# Patient Record
Sex: Male | Born: 1945 | Race: White | Hispanic: No | Marital: Married | Smoking: Former smoker
Health system: Southern US, Community
[De-identification: ages and names within clinical notes are randomized; demographics above are authoritative.]

## PROBLEM LIST (undated history)

## (undated) DIAGNOSIS — Z860101 Personal history of adenomatous and serrated colon polyps: Secondary | ICD-10-CM

## (undated) DIAGNOSIS — M2142 Flat foot [pes planus] (acquired), left foot: Secondary | ICD-10-CM

## (undated) DIAGNOSIS — E785 Hyperlipidemia, unspecified: Secondary | ICD-10-CM

## (undated) DIAGNOSIS — Z86010 Personal history of colonic polyps: Secondary | ICD-10-CM

## (undated) DIAGNOSIS — K219 Gastro-esophageal reflux disease without esophagitis: Secondary | ICD-10-CM

## (undated) DIAGNOSIS — K5790 Diverticulosis of intestine, part unspecified, without perforation or abscess without bleeding: Secondary | ICD-10-CM

## (undated) DIAGNOSIS — R5383 Other fatigue: Secondary | ICD-10-CM

## (undated) DIAGNOSIS — Z8546 Personal history of malignant neoplasm of prostate: Secondary | ICD-10-CM

## (undated) DIAGNOSIS — M2141 Flat foot [pes planus] (acquired), right foot: Secondary | ICD-10-CM

## (undated) DIAGNOSIS — R7989 Other specified abnormal findings of blood chemistry: Secondary | ICD-10-CM

## (undated) DIAGNOSIS — E7800 Pure hypercholesterolemia, unspecified: Secondary | ICD-10-CM

## (undated) HISTORY — DX: Personal history of malignant neoplasm of prostate: Z85.46

## (undated) HISTORY — DX: Personal history of adenomatous and serrated colon polyps: Z86.0101

## (undated) HISTORY — DX: Flat foot (pes planus) (acquired), right foot: M21.41

## (undated) HISTORY — DX: Other specified abnormal findings of blood chemistry: R79.89

## (undated) HISTORY — DX: Other fatigue: R53.83

## (undated) HISTORY — DX: Personal history of colonic polyps: Z86.010

## (undated) HISTORY — DX: Diverticulosis of intestine, part unspecified, without perforation or abscess without bleeding: K57.90

## (undated) HISTORY — DX: Pure hypercholesterolemia, unspecified: E78.00

## (undated) HISTORY — DX: Gastro-esophageal reflux disease without esophagitis: K21.9

## (undated) HISTORY — DX: Flat foot (pes planus) (acquired), right foot: M21.42

---

## 2008-07-08 ENCOUNTER — Ambulatory Visit: Admission: RE | Admit: 2008-07-08 | Discharge: 2008-07-26 | Payer: Self-pay | Admitting: Radiation Oncology

## 2008-09-19 ENCOUNTER — Encounter (INDEPENDENT_AMBULATORY_CARE_PROVIDER_SITE_OTHER): Payer: Self-pay | Admitting: Urology

## 2008-09-19 ENCOUNTER — Inpatient Hospital Stay (HOSPITAL_COMMUNITY): Admission: RE | Admit: 2008-09-19 | Discharge: 2008-09-20 | Payer: Self-pay | Admitting: Urology

## 2010-11-13 LAB — CBC
HCT: 44.1 % (ref 39.0–52.0)
HCT: 39.2 % (ref 39.0–52.0)
Hemoglobin: 14.9 g/dL (ref 13.0–17.0)
Hemoglobin: 13.1 g/dL (ref 13.0–17.0)
MCHC: 33.8 g/dL (ref 30.0–36.0)
MCHC: 33.5 g/dL (ref 30.0–36.0)
MCV: 90.1 fL (ref 78.0–100.0)
MCV: 90.5 fL (ref 78.0–100.0)
Platelets: 168 10*3/uL (ref 150–400)
Platelets: 151 10*3/uL (ref 150–400)
RDW: 14.2 % (ref 11.5–15.5)
RDW: 13.6 % (ref 11.5–15.5)
WBC: 8.9 10*3/uL (ref 4.0–10.5)

## 2010-11-13 LAB — DIFFERENTIAL
Basophils Absolute: 0 10*3/uL (ref 0.0–0.1)
Basophils Relative: 0 % (ref 0–1)
Eosinophils Absolute: 0 10*3/uL (ref 0.0–0.7)
Eosinophils Absolute: 0 10*3/uL (ref 0.0–0.7)
Eosinophils Relative: 1 % (ref 0–5)
Lymphs Abs: 1.4 10*3/uL (ref 0.7–4.0)
Monocytes Absolute: 0.6 10*3/uL (ref 0.1–1.0)
Neutrophils Relative %: 72 % (ref 43–77)

## 2010-11-13 LAB — BASIC METABOLIC PANEL
BUN: 15 mg/dL (ref 6–23)
BUN: 10 mg/dL (ref 6–23)
CO2: 28 mEq/L (ref 19–32)
CO2: 28 mEq/L (ref 19–32)
Calcium: 9.4 mg/dL (ref 8.4–10.5)
Chloride: 103 mEq/L (ref 96–112)
Creatinine, Ser: 1.16 mg/dL (ref 0.4–1.5)
GFR calc non Af Amer: >60 mL/min (ref >60)
Glucose, Bld: 83 mg/dL (ref 70–99)
Glucose, Bld: 99 mg/dL (ref 70–99)
Potassium: 4.3 mEq/L (ref 3.5–5.1)
Sodium: 141 mEq/L (ref 135–145)
Sodium: 139 mEq/L (ref 135–145)

## 2010-12-11 NOTE — Op Note (Signed)
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   POSTOPERATIVE DIAGNOSIS:  Clinically localized adenocarcinoma of  prostate (clinical stage T1c, NX, MX).   PROCEDURES:  1. Robotic-assisted laparoscopic radical prostatectomy (bilateral      nerve sparing).  2. Umbilical hernia repair.   SURGEON:  Dr. Heloise Purpura.   ASSISTANT:  Delia Chimes, nurse practitioner.   ANESTHESIA:  General.   COMPLICATIONS:  None.   ESTIMATED BLOOD LOSS:  100 mL.   INTRAVENOUS FLUIDS:  2 liters of lactated Ringer's.   SPECIMENS:  Prostate seminal vesicles.   DISPOSITION:  Specimen to Pathology.   DRAINS:  1. A 20-French Coude catheter.  2. A #19 Blake pelvic drain.   INDICATION:  [name] is a 65 year old gentleman with clinically  localized adenocarcinoma of Roberto prostate.  After discussion regarding  management options for treatment, [name] elected to proceed with surgical  therapy and Roberto above procedure.  In addition, [name] was noted on  preoperative evaluation to have an umbilical hernia.  [name] was mildly  symptomatic from this and after discussion regarding options for  management, [name] did elect to undergo concomitant umbilical hernia repair.  Roberto potential risks, complications, and alternative treatment options  associated with Roberto above procedures were discussed in detail and  informed consent was obtained.   DESCRIPTION OF PROCEDURE:  [name] was taken to Roberto operating room  and a general anesthetic was administered.  [name] was given preoperative  antibiotics, placed in Roberto dorsal  lithotomy position, and prepped and  draped in Roberto usual sterile fashion.  Next, a preoperative time-out was  performed.  An attempt was made to place a 22-French catheter.  However,  [name]'s urethra was noted to be somewhat stenotic.  Therefore, a  20-French Foley catheter was used to catheterize [name].  This was  placed without difficulty.  On examination of his abdomen, [name] was noted  to have an umbilical hernia measuring approximately 1.5 cm.  A site was  selected just superior to Roberto umbilicus for placement of Roberto camera  port.  This was placed using a standard open Hassan technique which  allowed entry into Roberto peritoneal cavity under direct vision without  difficulty.  A 12 mm port was then placed and a pneumoperitoneum  established.  Roberto 0 degrees lens was used to inspect Roberto abdomen and  there was no evidence for any intra-abdominal injuries or other  abnormalities.  Roberto remaining ports were then placed.  Bilateral 8 mm  robotic ports were placed 10 cm lateral to and just inferior to Roberto  camera port site.  An additional 8 mm robotic port was placed in Roberto far  left lateral abdominal wall.  A 5 mm port was placed between Roberto camera  port and Roberto right robotic port and a 12 mm port was placed in Roberto far  right lateral abdominal wall for laparoscopic assistance.  All ports  were placed under direct vision without difficulty.  Roberto surgical cart  was then docked.  With Roberto aid of Roberto cautery scissors, Roberto bladder was  reflected posteriorly allowing entry into Roberto space of Retzius and  identification of Roberto endopelvic fascia and prostate.  Roberto endopelvic  fascia was then incised from Roberto apex back to Roberto base of Roberto prostate  bilaterally and Roberto underlying levator muscle fibers were swept  laterally off Roberto prostate.  Roberto dorsal venous complex was thereby  isolated and it was then stapled and divided with a 45 mm flex ETS  stapler.  Roberto bladder neck was identified with Roberto  aid of Foley catheter  manipulation and it was divided anteriorly thereby exposing Roberto Foley  catheter.  Roberto catheter balloon was deflated and Roberto catheter was  brought into Roberto operative field and used to retract Roberto prostate  anteriorly.  Roberto posterior bladder neck was then divided and dissection  proceeded between Roberto prostate and bladder until Roberto vasa deferentia and  seminal vesicles were identified.  Roberto vasa deferentia were isolated,  divided and lifted anteriorly.  Roberto seminal vesicles were then dissected  down to their tips with care to control Roberto seminal vesicle arterial  blood supply with Hem-o-lok clips.  Roberto seminal vesicles were then  lifted anteriorly in Roberto space between Denonvilliers' fascia and Roberto  anterior rectum was bluntly developed thereby isolating Roberto vascular  pedicles of Roberto prostate.  Roberto lateral prostatic fascia was then incised  bilaterally allowing Roberto neurovascular bundles to be released.  Roberto  vascular pedicles of Roberto prostate were then ligated with Hem-o-lok clips  above Roberto level of Roberto neurovascular bundles and Roberto neurovascular  bundles were swept off Roberto apex of Roberto prostate and urethra.  Roberto  urethra was sharply transected allowing Roberto prostate specimen to be  disarticulated.  Roberto pelvis was then copiously irrigated and hemostasis  was ensured.  With irrigation in Roberto pelvis, air was injected into Roberto  rectal catheter and there was no evidence for a rectal injury.  Attention then turned to Roberto urethral anastomosis.  A 2-0 Vicryl slip-  knot was placed between Roberto posterior bladder neck, Denonvilliers'  fascia, and Roberto posterior bladder neck to reapproximate these  structures.  A double-armed 3-0 Monocryl suture was then used to perform  a 360 degree running tension-free anastomosis between Roberto bladder neck  and urethra.  A new 20-French Coude catheter was inserted into Roberto  bladder and Roberto catheter was irrigated.  Roberto anastomosis appeared to be   watertight and there were no blood clots within Roberto catheter.  A #19  Blake drain was brought through Roberto left robotic port and appropriately  positioned in Roberto pelvis.  It was secured to Roberto skin with a nylon  suture.  Roberto surgical cart was undocked.  Roberto right lateral 12 mm port  site was closed with a 0-Vicryl suture placed with Roberto aid of Roberto suture  passer device.  All remaining ports were then removed under direct  vision.  Roberto periumbilical camera port incision was then extended  inferiorly in a semicircular fashion around Roberto left side of Roberto  umbilicus.  Roberto camera port site was then extended down into Roberto fascial  opening that represented Roberto umbilical hernia.  Roberto fat off Roberto  abdominal wall within Roberto umbilical hernia was removed and 0-Prolene  figure-of-eight sutures were then used to repair Roberto hernia defect  reapproximating Roberto abdominal fascia.  Roberto upper portion of this  incision was closed with a running 0- Prolene suture.  This appeared to  result in excellent closure of Roberto fascia.  All port sites and incision  sites were injected with quarter percent Marcaine and reapproximated at  Roberto skin level with staples.  Sterile dressings were applied.  Roberto  Barr appeared to tolerate Roberto procedure well without complications.  [name] was able to be extubated and transferred into Roberto recovery unit in  satisfactory condition.     Heloise Purpura, MD  Electronically Signed    LB/MEDQ  D:  09/19/2008  T:  09/19/2008  Job:  [id number]

## 2012-02-09 ENCOUNTER — Emergency Department (HOSPITAL_COMMUNITY)
Admission: EM | Admit: 2012-02-09 | Discharge: 2012-02-09 | Disposition: A | Discharge status: Home / Self Care | Payer: Federal, State, Local not specified - PPO | Attending: Emergency Medicine | Admitting: Emergency Medicine

## 2012-02-09 ENCOUNTER — Encounter (HOSPITAL_COMMUNITY): Payer: Self-pay | Admitting: Emergency Medicine

## 2012-02-09 DIAGNOSIS — R55 Syncope and collapse: Secondary | ICD-10-CM | POA: Insufficient documentation

## 2012-02-09 DIAGNOSIS — R404 Transient alteration of awareness: Secondary | ICD-10-CM | POA: Insufficient documentation

## 2012-02-09 DIAGNOSIS — Z7982 Long term (current) use of aspirin: Secondary | ICD-10-CM | POA: Insufficient documentation

## 2012-02-09 DIAGNOSIS — R42 Dizziness and giddiness: Secondary | ICD-10-CM | POA: Insufficient documentation

## 2012-02-09 DIAGNOSIS — Z79899 Other long term (current) drug therapy: Secondary | ICD-10-CM | POA: Insufficient documentation

## 2012-02-09 LAB — POCT I-STAT, CHEM 8
Creatinine, Ser: 1.4 mg/dL — ABNORMAL HIGH (ref 0.50–1.35)
HCT: 44 % (ref 39.0–52.0)
Hemoglobin: 15 g/dL (ref 13.0–17.0)
Potassium: 4.6 mEq/L (ref 3.5–5.1)
Sodium: 139 mEq/L (ref 135–145)

## 2012-02-09 MED ORDER — SODIUM CHLORIDE 0.9 % IV BOLUS (SEPSIS)
1000.0000 mL | Freq: Once | INTRAVENOUS | Status: AC
  Administered 2012-02-09: 1000 mL via INTRAVENOUS

## 2012-02-09 NOTE — ED Notes (Signed)
Patient brought in via [facility]. With a syncopal episode patient was in church and singing and he felt hot and clammy prior to this event. He did not eat today, he advises that he has feel some sinus type symptoms this week, other than that he denies any problems. He consumed ETOH last pm and feel that he may be dehydrated. He denies pain or discomfort at this time. Vital signs stable.

## 2012-02-09 NOTE — ED Notes (Signed)
IV  Bolus  completed

## 2012-02-09 NOTE — ED Provider Notes (Signed)
History     CSN: [id number]  Arrival date & time 02/09/12  1234   First MD Initiated Contact with Patient 02/09/12 1257      Chief Complaint  Patient presents with  . Loss of Consciousness   history of present illness: Patient suffered syncopal event after standing for 3.5 hours in church this morning. Reports feeling lightheaded immediately prior to event. Patient also admits to drinking alcohol last night. He denies having had any chest pain shortness of breath headache or abdominal pain. He is presently asymptomatic. Treat with oxygen while in route. Brought by EMS. (Consider location/radiation/quality/duration/timing/severity/associated sxs/prior treatment) HPI  History reviewed. No pertinent past medical history. Past medical history hypercholesterolemia, prostate cancer History reviewed. No pertinent past surgical history. Surgical history History reviewed. No pertinent family history. Prostate surgery History  Substance Use Topics  . Smoking status: Not on file  . Smokeless tobacco: Not on file  . Alcohol Use: Not on file     social history nonsmoker positive alcohol no illicit drug use Review of Systems  Constitutional: Negative.   HENT: Negative.   Respiratory: Negative.   Cardiovascular:       Syncope  Gastrointestinal: Negative.   Musculoskeletal: Negative.   Skin: Negative.   Neurological: Negative.   Hematological: Negative.   Psychiatric/Behavioral: Negative.   All other systems reviewed and are negative.    Allergies  Review of patient's allergies indicates no known allergies.  Home Medications   Current Outpatient Rx  Name Route Sig Dispense Refill  . ASPIRIN EC 81 MG PO TBEC Oral Take 81 mg by mouth at bedtime.    . CO Q 10 PO Oral Take 1 tablet by mouth every morning.    Marland Kitchen OMEGA-3 FATTY ACIDS 1000 MG PO CAPS Oral Take 1 g by mouth 2 (two) times daily. Take 1 tablet in the morning and take 2 tablets at night.    Marland Kitchen GLUCOSAMINE-CHONDROITIN PO  Oral Take 1 tablet by mouth daily.    Marland Kitchen ROSUVASTATIN CALCIUM 10 MG PO TABS Oral Take 10 mg by mouth every morning.      BP 109/73  Pulse 85  Temp 98.3 F (36.8 C) (Oral)  Resp 16  SpO2 94%  Physical Exam  Nursing note and vitals reviewed. Constitutional: He is oriented to person, place, and time. He appears well-developed and well-nourished.  HENT:  Head: Normocephalic and atraumatic.       Mucous membranes dry  Eyes: Conjunctivae are normal. Pupils are equal, round, and reactive to light.  Neck: Neck supple. No tracheal deviation present. No thyromegaly present.  Cardiovascular: Normal rate and regular rhythm.   No murmur heard. Pulmonary/Chest: Effort normal and breath sounds normal.  Abdominal: Soft. Bowel sounds are normal. He exhibits no distension. There is no tenderness.  Musculoskeletal: Normal range of motion. He exhibits no edema and no tenderness.  Neurological: He is alert and oriented to person, place, and time. Coordination normal.       Gait normal not lightheaded on standing  Skin: Skin is warm and dry. No rash noted.  Psychiatric: He has a normal mood and affect.    ED Course  Procedures (including critical care time)  Labs Reviewed - No data to display No results found.   Date: 02/09/2012  Rate: 65  Rhythm: normal sinus rhythm  QRS Axis: normal  Intervals: normal  ST/T Wave abnormalities: normal  Conduction Disutrbances: none  Narrative Interpretation: unremarkable Unchanged from 09/09/2008  Results for orders placed during the hospital  encounter of 02/09/12  POCT I-STAT, CHEM 8      Component Value Range   Sodium 139  135 - 145 mEq/L   Potassium 4.6  3.5 - 5.1 mEq/L   Chloride 103  96 - 112 mEq/L   BUN 21  6 - 23 mg/dL   Creatinine, Ser 1.61 (*) 0.50 - 1.35 mg/dL   Glucose, Bld 096 (*) 70 - 99 mg/dL   Calcium, Ion 0.45  4.09 - 1.30 mmol/L   TCO2 26  0 - 100 mmol/L   Hemoglobin 15.0  13.0 - 17.0 g/dL   HCT 81.1  91.4 - 78.2 %   No results  found.  No diagnosis found.  3:20 PM patient feels well and ready to gog home MDM  Syncope likely due to mild dehydration   plan encourage by mouth hydration   avoid alcohol Followup Dr. Tenny Craw this week diagnosis #1 syncope #2 renal insufficiency       Doug Sou, MD 02/09/12 1553

## 2012-02-09 NOTE — ED Notes (Signed)
Patient alert and oriented time 4, he denies pain or discomfort. He is being discharged with his wife with instructions, he and his wife verbalize an understanding.

## 2018-12-17 ENCOUNTER — Other Ambulatory Visit: Payer: Self-pay | Admitting: Family Medicine

## 2018-12-17 DIAGNOSIS — Z0000 Encounter for general adult medical examination without abnormal findings: Secondary | ICD-10-CM

## 2018-12-30 ENCOUNTER — Ambulatory Visit
Admission: RE | Admit: 2018-12-30 | Discharge: 2018-12-30 | Disposition: A | Discharge status: Home / Self Care | Payer: Federal, State, Local not specified - PPO | Source: Ambulatory Visit | Attending: Family Medicine | Admitting: Family Medicine

## 2018-12-30 ENCOUNTER — Other Ambulatory Visit: Payer: Self-pay | Admitting: Family Medicine

## 2018-12-30 DIAGNOSIS — Z0000 Encounter for general adult medical examination without abnormal findings: Secondary | ICD-10-CM

## 2019-03-15 ENCOUNTER — Telehealth: Payer: Self-pay | Admitting: Hematology and Oncology

## 2019-03-15 NOTE — Telephone Encounter (Signed)
Received a new hem referral from Dr. Harrington Challenger for monocytosis. Pt has been cld to see Dr. Lindi Adie on 9/3 at 1pm. Aware to arrive 20 minutes early.

## 2019-03-31 NOTE — Progress Notes (Addendum)
[facility] NOTE  Patient Care Team: Lawerance Cruel, MD as PCP - General  CHIEF COMPLAINTS/PURPOSE OF CONSULTATION:  Newly diagnosed monocytosis  HISTORY OF PRESENTING ILLNESS:  [name] 73 y.o. male is here because of recent diagnosis of monocytosis. He was referred by his PCP, Dr. Harrington Challenger.  He has had long standing mild monocytosis but lately it has gone about the normal range and because of that the clinic appointment was requested.  He does not report any recent infections or illnesses.  He is otherwise very healthy apart from a cholesterol medication and arthritis he feels great.  I reviewed his records extensively and collaborated the history with the patient.  MEDICAL HISTORY:  Hypercholesterolemia and osteoarthritis, prostate cancer status post prostatectomy SURGICAL HISTORY: Prostatectomy, tonsillectomy, cataract surgery SOCIAL HISTORY:  Denies any tobacco alcohol or recreational drug use, quit tobacco 1996 FAMILY HISTORY: Mother died from brain tumor and liver failure hypertension and emphysema and coronary artery disease run in the family. ALLERGIES:  has No Known Allergies.  MEDICATIONS:  Current Outpatient Medications  Medication Sig Dispense Refill  . cholecalciferol (VITAMIN D) 25 MCG (1000 UT) tablet Take 1 tablet (1,000 Units total) by mouth daily.    . Coenzyme Q10 (CO Q 10 PO) Take 1 tablet by mouth every morning.    . fexofenadine (ALLEGRA) 180 MG tablet Take 1 tablet (180 mg total) by mouth daily.    . fish oil-omega-3 fatty acids 1000 MG capsule Take 1 g by mouth 2 (two) times daily. Take 1 tablet in the morning and take 2 tablets at night.    Marland Kitchen GLUCOSAMINE-CHONDROITIN PO Take 1 tablet by mouth daily.    . Multiple Vitamins-Minerals (SYSTANE ICAPS AREDS2) TABS Take 2 tablets by mouth daily.    . rosuvastatin (CRESTOR) 10 MG tablet Take 10 mg by mouth every morning.     No current facility-administered medications for this visit.      REVIEW OF SYSTEMS:   Constitutional: Denies fevers, chills or abnormal night sweats Eyes: Denies blurriness of vision, double vision or watery eyes Ears, nose, mouth, throat, and face: Denies mucositis or sore throat Respiratory: Denies cough, dyspnea or wheezes Cardiovascular: Denies palpitation, chest discomfort or lower extremity swelling Gastrointestinal:  Denies nausea, heartburn or change in bowel habits Skin: Denies abnormal skin rashes Lymphatics: Denies new lymphadenopathy or easy bruising Neurological:Denies numbness, tingling or new weaknesses Behavioral/Psych: Mood is stable, no new changes  All other systems were reviewed with the patient and are negative.  PHYSICAL EXAMINATION: ECOG PERFORMANCE STATUS: 0 - Asymptomatic  Vitals:   04/01/19 1316  BP: (!) 155/92  Pulse: 66  Resp: 18  Temp: 99.1 F (37.3 C)  SpO2: 97%   Filed Weights   04/01/19 1316  Weight: 213 lb 11.2 oz (96.9 kg)    GENERAL:alert, no distress and comfortable SKIN: skin color, texture, turgor are normal, no rashes or significant lesions EYES: normal, conjunctiva are pink and non-injected, sclera clear OROPHARYNX:no exudate, no erythema and lips, buccal mucosa, and tongue normal  NECK: supple, thyroid normal size, non-tender, without nodularity LYMPH:  no palpable lymphadenopathy in the cervical, axillary or inguinal LUNGS: clear to auscultation and percussion with normal breathing effort HEART: regular rate & rhythm and no murmurs and no lower extremity edema ABDOMEN:abdomen soft, non-tender and normal bowel sounds Musculoskeletal:no cyanosis of digits and no clubbing  PSYCH: alert & oriented x 3 with fluent speech NEURO: no focal motor/sensory deficits  LABORATORY DATA:  I have reviewed  the data as listed Lab Results  Component Value Date   WBC 6.0 04/01/2019   HGB 13.9 04/01/2019   HCT 42.9 04/01/2019   MCV 91.9 04/01/2019   PLT 182 04/01/2019   Lab Results  Component Value  Date   NA 139 02/09/2012   K 4.6 02/09/2012   CL 103 02/09/2012   CO2 29 09/20/2008    RADIOGRAPHIC STUDIES: I have personally reviewed the radiological reports and agreed with the findings in the report.  ASSESSMENT AND PLAN:  Monocytosis Monocytosis: I discussed with the patient the differential diagnosis of monocytosis.  We discussed the significance of monocytes is antigen presenting cells as well as many other functions and are critical to immunity.  They can also serve as destroyer soft debris with their macrophage function.  Possible etiologies: We will need to rule out CML and other pulmonary disorders with BCR-ABL and flow cytometry.  I will also check the peripheral smear today.  I will discuss with the patient next week with a telephone visit to go over the results. If he has any concern for leukemia then we will need to perform a bone marrow biopsy.   All questions were answered. The patient knows to call the clinic with any problems, questions or concerns.   Rulon Eisenmenger, MD 04/01/2019    I, Molly Dorshimer, am acting as scribe for Nicholas Lose, MD.  I have reviewed the above documentation for accuracy and completeness, and I agree with the above.  Addendum: I reviewed today's CBC and there is no significant monocytosis noted.  We will await the rest of the blood work and I will speak to him about the results next week.

## 2019-04-01 ENCOUNTER — Other Ambulatory Visit: Payer: Self-pay

## 2019-04-01 ENCOUNTER — Inpatient Hospital Stay: Payer: Federal, State, Local not specified - PPO

## 2019-04-01 ENCOUNTER — Inpatient Hospital Stay
Payer: Federal, State, Local not specified - PPO | Attending: Hematology and Oncology | Admitting: Hematology and Oncology

## 2019-04-01 VITALS — BP 155/92 | HR 66 | Temp 99.1°F | Resp 18 | Wt 213.7 lb

## 2019-04-01 DIAGNOSIS — Z79899 Other long term (current) drug therapy: Secondary | ICD-10-CM | POA: Diagnosis not present

## 2019-04-01 DIAGNOSIS — D72821 Monocytosis (symptomatic): Secondary | ICD-10-CM | POA: Insufficient documentation

## 2019-04-01 DIAGNOSIS — Z8546 Personal history of malignant neoplasm of prostate: Secondary | ICD-10-CM | POA: Diagnosis not present

## 2019-04-01 DIAGNOSIS — Z9079 Acquired absence of other genital organ(s): Secondary | ICD-10-CM | POA: Diagnosis not present

## 2019-04-01 DIAGNOSIS — Z87891 Personal history of nicotine dependence: Secondary | ICD-10-CM | POA: Insufficient documentation

## 2019-04-01 DIAGNOSIS — M1990 Unspecified osteoarthritis, unspecified site: Secondary | ICD-10-CM | POA: Diagnosis not present

## 2019-04-01 LAB — CBC WITH DIFFERENTIAL (CANCER CENTER ONLY)
Abs Immature Granulocytes: 0.02 10*3/uL (ref 0.00–0.07)
Basophils Absolute: 0 10*3/uL (ref 0.0–0.1)
Basophils Relative: 1 %
Eosinophils Absolute: 0.1 10*3/uL (ref 0.0–0.5)
Eosinophils Relative: 2 %
HCT: 42.9 % (ref 39.0–52.0)
Hemoglobin: 13.9 g/dL (ref 13.0–17.0)
Immature Granulocytes: 0 %
Lymphocytes Relative: 25 %
Lymphs Abs: 1.5 10*3/uL (ref 0.7–4.0)
MCH: 29.8 pg (ref 26.0–34.0)
MCHC: 32.4 g/dL (ref 30.0–36.0)
MCV: 91.9 fL (ref 80.0–100.0)
Monocytes Absolute: 0.9 10*3/uL (ref 0.1–1.0)
Monocytes Relative: 15 %
Neutro Abs: 3.5 10*3/uL (ref 1.7–7.7)
Neutrophils Relative %: 57 %
Platelet Count: 182 10*3/uL (ref 150–400)
RBC: 4.67 MIL/uL (ref 4.22–5.81)
RDW: 14.6 % (ref 11.5–15.5)
WBC Count: 6 10*3/uL (ref 4.0–10.5)
nRBC: 0 % (ref 0.0–0.2)

## 2019-04-01 MED ORDER — VITAMIN D3 25 MCG (1000 UNIT) PO TABS: 1000.0000 [IU] | ORAL_TABLET | Freq: Every day | ORAL | Status: AC

## 2019-04-01 MED ORDER — SYSTANE ICAPS AREDS2 PO TABS: 2.0000 | ORAL_TABLET | Freq: Every day | ORAL | Status: AC

## 2019-04-01 MED ORDER — FEXOFENADINE HCL 180 MG PO TABS: 180.0000 mg | ORAL_TABLET | Freq: Every day | ORAL | Status: AC

## 2019-04-01 NOTE — Assessment & Plan Note (Signed)
Monocytosis: I discussed with the patient the differential diagnosis of monocytosis.  We discussed the significance of monocytes is antigen presenting cells as well as many other functions and are critical to immunity.  They can also serve as destroyer soft debris with their macrophage function.  Possible etiologies: We will need to rule out CML and other pulmonary disorders with BCR-ABL and flow cytometry.  I will also check the peripheral smear today.  I will discuss with the patient next week with a telephone visit to go over the results.

## 2019-04-02 ENCOUNTER — Telehealth: Payer: Self-pay | Admitting: Hematology and Oncology

## 2019-04-02 ENCOUNTER — Telehealth: Payer: Self-pay

## 2019-04-02 LAB — FLOW CYTOMETRY

## 2019-04-02 NOTE — Telephone Encounter (Signed)
RN spoke with flo cytometry staff. Per Dr. Gari Crown no indications of blast, or increase in lymphocytes resulting in no indication of leukemia or lymphoma.    MD will be notified.

## 2019-04-02 NOTE — Telephone Encounter (Signed)
I left a message regarding 9/9

## 2019-04-06 NOTE — Progress Notes (Signed)
  HEMATOLOGY-ONCOLOGY TELEPHONE VISIT PROGRESS NOTE  I connected with [name] on 04/07/2019 at 10:45 AM EDT by telephone and verified that I am speaking with the correct person using two identifiers.  I discussed the limitations, risks, security and privacy concerns of performing an evaluation and management service by telephone and the availability of in person appointments.  I also discussed with the patient that there may be a patient responsible charge related to this service. The patient expressed understanding and agreed to proceed.   History of Present Illness: [name] is a 73 y.o. male with above-mentioned history of monocytosis. Labs from 04/01/19: BCR-ABL pending, pathologist smear showed no indication of blasts and no increase in lymphocytes. He presents over the phone today to review his labs.   Observations/Objective:  No findings suggestive of any hematological disorder   Assessment Plan:  Monocytosis Lab review: No evidence of monocytosis. We canceled flow cytometry because there was no concern for leukemia or lymphoma. BCR/ABL: Pending Given the fact that the monocytosis is resolved we do not need to do any further work-up at this time. We are happy to see him at anytime in the future if his blood counts change.     I discussed the assessment and treatment plan with the patient. The patient was provided an opportunity to ask questions and all were answered. The patient agreed with the plan and demonstrated an understanding of the instructions. The patient was advised to call back or seek an in-person evaluation if the symptoms worsen or if the condition fails to improve as anticipated.   I provided 11 minutes of non-face-to-face time during this encounter.   Rulon Eisenmenger, MD 04/07/2019    I, Molly Dorshimer, am acting as scribe for Nicholas Lose, MD.  I have reviewed the above documentation for accuracy and completeness, and I agree with the above.

## 2019-04-07 ENCOUNTER — Inpatient Hospital Stay (HOSPITAL_BASED_OUTPATIENT_CLINIC_OR_DEPARTMENT_OTHER): Payer: Federal, State, Local not specified - PPO | Admitting: Hematology and Oncology

## 2019-04-07 ENCOUNTER — Other Ambulatory Visit: Payer: Self-pay

## 2019-04-07 DIAGNOSIS — D72821 Monocytosis (symptomatic): Secondary | ICD-10-CM

## 2019-04-07 NOTE — Assessment & Plan Note (Signed)
Lab review: No evidence of monocytosis. We canceled flow cytometry because there was no concern for leukemia or lymphoma. BCR/ABL: Pending Given the fact that the monocytosis is resolved we do not need to do any further work-up at this time. We are happy to see [name] at anytime in the future if his blood counts change.

## 2019-04-16 LAB — BCR ABL1 FISH (GENPATH)

## 2020-04-25 ENCOUNTER — Encounter (HOSPITAL_COMMUNITY): Payer: Self-pay | Admitting: Emergency Medicine

## 2020-04-25 ENCOUNTER — Emergency Department (HOSPITAL_COMMUNITY): Payer: Federal, State, Local not specified - PPO

## 2020-04-25 ENCOUNTER — Emergency Department (HOSPITAL_COMMUNITY)
Admission: EM | Admit: 2020-04-25 | Discharge: 2020-04-25 | Disposition: A | Discharge status: Home / Self Care | Payer: Federal, State, Local not specified - PPO | Attending: Emergency Medicine | Admitting: Emergency Medicine

## 2020-04-25 DIAGNOSIS — W010XXA Fall on same level from slipping, tripping and stumbling without subsequent striking against object, initial encounter: Secondary | ICD-10-CM | POA: Insufficient documentation

## 2020-04-25 DIAGNOSIS — S50311A Abrasion of right elbow, initial encounter: Secondary | ICD-10-CM | POA: Diagnosis not present

## 2020-04-25 DIAGNOSIS — Z23 Encounter for immunization: Secondary | ICD-10-CM | POA: Diagnosis not present

## 2020-04-25 DIAGNOSIS — S0101XA Laceration without foreign body of scalp, initial encounter: Secondary | ICD-10-CM | POA: Insufficient documentation

## 2020-04-25 HISTORY — DX: Hyperlipidemia, unspecified: E78.5

## 2020-04-25 MED ORDER — TETANUS-DIPHTH-ACELL PERTUSSIS 5-2.5-18.5 LF-MCG/0.5 IM SUSP
0.5000 mL | Freq: Once | INTRAMUSCULAR | Status: AC
  Administered 2020-04-25: 0.5 mL via INTRAMUSCULAR
  Filled 2020-04-25: qty 0.5

## 2020-04-25 NOTE — Discharge Instructions (Signed)
You have a scalp laceration.  Your staples need to come out in about a week and you can ask your primary care doctor or go to urgent care to remove the staples.  Your CT scan and x-rays today did not show any bleeding or fractures  Follow-up with your primary care doctor  Take Tylenol or Motrin for pain.  Return to ER if you have worse headaches, vomiting, dizziness, passing out.

## 2020-04-25 NOTE — ED Notes (Signed)
Pt's wife, Byrd Hesselbach, called to pick pt up.

## 2020-04-25 NOTE — ED Provider Notes (Signed)
[name]-EMERGENCY DEPT Provider Note   CSN: [id number] Arrival date & time: 04/25/20  1419     History Chief Complaint  Patient presents with  . Fall    [name] is a 74 y.o. male hx of HL, here presenting with fall.  Patient states that he tripped over something and hit the back of his head.  He states that happened about 7 hours ago.  He has a laceration the back of his scalp.  He also noticed abrasion on the right elbow area.  He states that he might have his tetanus about 5 to 6 years ago.  Not on blood thinners.  The history is provided by the patient.       Past Medical History:  Diagnosis Date  . Hyperlipidemia     Patient Active Problem List   Diagnosis Date Noted  . Monocytosis 04/01/2019    No past surgical history on file.     No family history on file.  Social History   Tobacco Use  . Smoking status: Not on file  Substance Use Topics  . Alcohol use: Yes  . Drug use: Not on file    Home Medications Prior to Admission medications   Medication Sig Start Date End Date Taking? Authorizing Provider  cholecalciferol (VITAMIN D) 25 MCG (1000 UT) tablet Take 1 tablet (1,000 Units total) by mouth daily. 04/01/19   Serena Croissant, MD  Coenzyme Q10 (CO Q 10 PO) Take 1 tablet by mouth every morning.    [provider]  fexofenadine (ALLEGRA) 180 MG tablet Take 1 tablet (180 mg total) by mouth daily. 04/01/19   Serena Croissant, MD  fish oil-omega-3 fatty acids 1000 MG capsule Take 1 g by mouth 2 (two) times daily. Take 1 tablet in the morning and take 2 tablets at night.    [provider]  GLUCOSAMINE-CHONDROITIN PO Take 1 tablet by mouth daily.    [provider]  Multiple Vitamins-Minerals (SYSTANE ICAPS AREDS2) TABS Take 2 tablets by mouth daily. 04/01/19   Serena Croissant, MD  rosuvastatin (CRESTOR) 10 MG tablet Take 10 mg by mouth every morning.    [provider]    Allergies    Patient has no known  allergies.  Review of Systems   Review of Systems  Skin: Positive for wound.  All other systems reviewed and are negative.   Physical Exam Updated Vital Signs BP (!) 176/100 (BP Location: Left Arm)   Pulse 70   Temp 98 F (36.7 C) (Oral)   Resp 17   SpO2 100%   Physical Exam Vitals and nursing note reviewed.  HENT:     Head: Normocephalic.     Comments: 4 cm laceration in the posterior scalp with some dried blood    Mouth/Throat:     Mouth: Mucous membranes are moist.  Eyes:     Extraocular Movements: Extraocular movements intact.     Pupils: Pupils are equal, round, and reactive to light.  Neck:     Comments: No midline tenderness Cardiovascular:     Rate and Rhythm: Normal rate and regular rhythm.     Pulses: Normal pulses.     Heart sounds: Normal heart sounds.  Pulmonary:     Effort: Pulmonary effort is normal.  Abdominal:     General: Abdomen is flat.  Musculoskeletal:     Cervical back: Normal range of motion and neck supple.     Comments: Abrasion on the right elbow and able  to range right elbow  Skin:    General: Skin is warm.     Capillary Refill: Capillary refill takes less than 2 seconds.  Neurological:     General: No focal deficit present.     Mental Status: He is alert and oriented to person, place, and time.     Cranial Nerves: No cranial nerve deficit.     Motor: No weakness.     Comments: Normal strength and sensation and normal gait  Psychiatric:        Mood and Affect: Mood normal.        Behavior: Behavior normal.     ED Results / Procedures / Treatments   Labs (all labs ordered are listed, but only abnormal results are displayed) Labs Reviewed - No data to display  EKG None  Radiology DG Elbow Complete Right  Result Date: 04/25/2020 CLINICAL DATA:  Fall with abrasion EXAM: RIGHT ELBOW - COMPLETE 3+ VIEW COMPARISON:  None. FINDINGS: There is no evidence of fracture, dislocation, or joint effusion. There is no evidence of  arthropathy or other focal bone abnormality. Soft tissues are unremarkable. IMPRESSION: Negative. Electronically Signed   By: Jasmine Pang M.D.   On: 04/25/2020 21:03   CT Head Wo Contrast  Result Date: 04/25/2020 CLINICAL DATA:  Fall with laceration EXAM: CT HEAD WITHOUT CONTRAST CT CERVICAL SPINE WITHOUT CONTRAST TECHNIQUE: Multidetector CT imaging of the head and cervical spine was performed following the standard protocol without intravenous contrast. Multiplanar CT image reconstructions of the cervical spine were also generated. COMPARISON:  None. FINDINGS: CT HEAD FINDINGS Brain: No acute territorial infarction, hemorrhage or intracranial mass. Moderate atrophy. Mild hypodensity in the white matter consistent with chronic small vessel ischemic change. The ventricles are nonenlarged. Vascular: No hyperdense vessels.  No unexpected calcification Skull: Normal. Negative for fracture or focal lesion. Sinuses/Orbits: No acute finding. Other: Small scalp laceration at the posterior vertex CT CERVICAL SPINE FINDINGS Alignment: No subluxation.  Facet alignment is within normal limits. Skull base and vertebrae: No acute fracture. No primary bone lesion or focal pathologic process. Soft tissues and spinal canal: No prevertebral fluid or swelling. No visible canal hematoma. Disc levels: Mild degenerative change at C4-C5 with moderate degenerative change at C6-C7. Mild facet degenerative change at multiple levels. Upper chest: Negative. Other: None IMPRESSION: 1. No CT evidence for acute intracranial abnormality. Atrophy and mild chronic small vessel ischemic change of the white matter. 2. Degenerative changes of the cervical spine. No acute osseous abnormality. Electronically Signed   By: Jasmine Pang M.D.   On: 04/25/2020 21:09   CT Cervical Spine Wo Contrast  Result Date: 04/25/2020 CLINICAL DATA:  Fall with laceration EXAM: CT HEAD WITHOUT CONTRAST CT CERVICAL SPINE WITHOUT CONTRAST TECHNIQUE: Multidetector  CT imaging of the head and cervical spine was performed following the standard protocol without intravenous contrast. Multiplanar CT image reconstructions of the cervical spine were also generated. COMPARISON:  None. FINDINGS: CT HEAD FINDINGS Brain: No acute territorial infarction, hemorrhage or intracranial mass. Moderate atrophy. Mild hypodensity in the white matter consistent with chronic small vessel ischemic change. The ventricles are nonenlarged. Vascular: No hyperdense vessels.  No unexpected calcification Skull: Normal. Negative for fracture or focal lesion. Sinuses/Orbits: No acute finding. Other: Small scalp laceration at the posterior vertex CT CERVICAL SPINE FINDINGS Alignment: No subluxation.  Facet alignment is within normal limits. Skull base and vertebrae: No acute fracture. No primary bone lesion or focal pathologic process. Soft tissues and spinal canal: No prevertebral fluid  or swelling. No visible canal hematoma. Disc levels: Mild degenerative change at C4-C5 with moderate degenerative change at C6-C7. Mild facet degenerative change at multiple levels. Upper chest: Negative. Other: None IMPRESSION: 1. No CT evidence for acute intracranial abnormality. Atrophy and mild chronic small vessel ischemic change of the white matter. 2. Degenerative changes of the cervical spine. No acute osseous abnormality. Electronically Signed   By: Jasmine Pang M.D.   On: 04/25/2020 21:09    Procedures Procedures (including critical care time)  LACERATION REPAIR Performed by: Richardean Canal Authorized by: Richardean Canal Consent: Verbal consent obtained. Risks and benefits: risks, benefits and alternatives were discussed Consent given by: patient Patient identity confirmed: provided demographic data Prepped and Draped in normal sterile fashion Wound explored  Laceration Location: Posterior scalp  Laceration Length: 4 cm  No Foreign Bodies seen or palpated  Anesthesia: none   Local anesthetic: none     Irrigation method: syringe Amount of cleaning: standard  Skin closure: staples   Number of staples: 4   Technique: staples   Patient tolerance: Patient tolerated the procedure well with no immediate complications.   Medications Ordered in ED Medications  Tdap (BOOSTRIX) injection 0.5 mL (0.5 mLs Intramuscular Given 04/25/20 2023)    ED Course  I have reviewed the triage vital signs and the nursing notes.  Pertinent labs & imaging results that were available during my care of the patient were reviewed by me and considered in my medical decision making (see chart for details).    MDM Rules/Calculators/A&P                         Wesly Whisenant is a 74 y.o. male presenting with scalp laceration.  Patient had a mechanical fall and self laceration as well as right elbow abrasion.  He has nonfocal neuro exam.  Laceration was stapled.  Tetanus is updated.  Will get CT head neck and x-rays.  9:44 PM CT head and neck unremarkable.  X-ray showed no fracture.  Stable for discharge and staple removal in a week.     Final Clinical Impression(s) / ED Diagnoses Final diagnoses:  None    Rx / DC Orders ED Discharge Orders    None       Charlynne Pander, MD 04/25/20 2145

## 2020-04-25 NOTE — ED Notes (Signed)
[name], wife, please call for a ride home, [phone].

## 2020-04-25 NOTE — ED Triage Notes (Signed)
Per EMS-fell forward and landed on back hitting head-laceration to back of head-mechanical fall-bleeding controlled-no LOC-no blood thinners-abrasion to right elbow

## 2022-01-23 NOTE — Progress Notes (Unsigned)
Cardiology Office Note:    Date:  01/24/2022   ID:  [name], DOB 23-Jan-1946, MRN [id number]  PCP:  Daisy Floro, MD   [facility] Providers Cardiologist:  Christell Constant, MD     Referring MD: Daisy Floro, MD   CC: Risk eval, SCD in brother Consulted for the evaluation of FHX of HF at the behest of Dr. Tenny Craw  History of Present Illness:    [name] is a 76 y.o. male with a hx of Fhx of HF, HLD who presents for evaluation 01/24/22.  Patient notes that he is feeling well overall.   Easily tired. Retired but active in the year.  Able to take care of his [place] of a yard.  Can move and trip hedges.  Former [employer].   Has had no chest pain, chest pressure, chest tightness, chest stinging.  Patient exertion notable for doing yard work with  and feels no symptoms.  Takes more breaks than used to.   No shortness of breath, DOE.  No PND or orthopnea.  No weight gain, leg swelling , or abdominal swelling.  No syncope or near syncope (noted one episode of passing out in a hot church after prolonged standing ten years ago) . Notes  no palpitations or funny heart beats.     No prior cardiac testing.   Past Medical History:  Diagnosis Date   Abnormal CBC    Diverticulosis    Fatigue    Flat feet    GERD (gastroesophageal reflux disease)    High cholesterol    History of prostate cancer    Hx of adenomatous colonic polyps    Hyperlipidemia     No past surgical history on file.  Current Medications: Current Meds  Medication Sig   calcium carbonate (TUMS) 500 MG chewable tablet Chew 1 tablet by mouth daily.   cholecalciferol (VITAMIN D) 25 MCG (1000 UT) tablet Take 1 tablet (1,000 Units total) by mouth daily.   Coenzyme Q10 (CO Q 10 PO) Take 1 tablet by mouth every morning.   COLLAGEN-BORON-HYALURONIC ACID PO Take by mouth.   famotidine-calcium carbonate-magnesium hydroxide (PEPCID COMPLETE) [phone] MG chewable  tablet Chew 1 tablet by mouth 2 (two) times daily as needed.   fexofenadine (ALLEGRA) 180 MG tablet Take 1 tablet (180 mg total) by mouth daily.   fish oil-omega-3 fatty acids 1000 MG capsule Take 1 g by mouth 2 (two) times daily. Take 1 tablet in the morning and take 2 tablets at night.   GLUCOSAMINE-CHONDROITIN PO Take 1 tablet by mouth daily.   Multiple Vitamins-Minerals (SYSTANE ICAPS AREDS2) TABS Take 2 tablets by mouth daily.   rosuvastatin (CRESTOR) 10 MG tablet Take 10 mg by mouth every morning.     Allergies:   Grass pollen([id number]-o-r-t-swt vern) and Other   Social History   Socioeconomic History   Marital status: Married    Spouse name: Not on file   Number of children: Not on file   Years of education: Not on file   Highest education level: Not on file  Occupational History   Not on file  Tobacco Use   Smoking status: Former    Types: Cigarettes    Quit date: 2003    Years since quitting: 20.5   Smokeless tobacco: Never  Substance and Sexual Activity   Alcohol use: Yes   Drug use: Not on file   Sexual activity: Not on file  Other Topics Concern  Not on file  Social History Narrative   Not on file   Social Determinants of Health   Financial Resource Strain: Not on file  Food Insecurity: Not on file  Transportation Needs: Not on file  Physical Activity: Not on file  Stress: Not on file  Social Connections: Not on file     Family History: The patient's family history includes Heart failure in his brother and father; Liver disease in his brother. Brother died in his sleep in the setting of getting puffy.  Died in his easy chair. Father had prior valve surgery and open heart surgery.  May have had amiodarone induced pulmonary toxicity.  ROS:   Please see the history of present illness.     All other systems reviewed and are negative.  EKGs/Labs/Other Studies Reviewed:    The following studies were reviewed today:  EKG:  EKG is  ordered today.  The ekg  ordered today demonstrates  01/24/22: NSR rate 62  Recent Labs: No results found for requested labs within last 365 days.  Recent Lipid Panel No results found for: "CHOL", "TRIG", "HDL", "CHOLHDL", "VLDL", "LDLCALC", "LDLDIRECT"       Physical Exam:    VS:  BP 118/80   Pulse 63   Ht 5\' 11"  (1.803 m)   Wt 213 lb (96.6 kg)   SpO2 96%   BMI 29.71 kg/m     Wt Readings from Last 3 Encounters:  01/24/22 213 lb (96.6 kg)  04/01/19 213 lb 11.2 oz (96.9 kg)    Gen: no distress   Neck: No JVD  Ears: R ear Frank Sign Cardiac: No Rubs or Gallops, systolic crescendo murmur, RRR +2 radial pulses Respiratory: Clear to auscultation bilaterally, normal effort, normal  respiratory rate GI: Soft, nontender, non-distended  MS: No  edema; moves all extremities Integument: Skin feels warm Neuro:  At time of evaluation, alert and oriented to person/place/time/situation  Psych: Normal affect, patient feels well    ASSESSMENT:    1. Heart murmur, systolic   2. Family history of sudden cardiac death (SCD)   3. Mixed hyperlipidemia    PLAN:    New systolic heart murmur FH of valve disease FH of SCD death - may be aortic sclerosis but will get echo  HLD - LDL 73 - will get CAC score - if elevated we have discussed LDL goal of 55 and pros and cons of stress testing  6 months follow up with me unless new issues       Medication Adjustments/Labs and Tests Ordered: Current medicines are reviewed at length with the patient today.  Concerns regarding medicines are outlined above.  Orders Placed This Encounter  Procedures   CT CARDIAC SCORING (SELF PAY ONLY)   EKG 12-Lead   ECHOCARDIOGRAM COMPLETE   No orders of the defined types were placed in this encounter.   Patient Instructions  Medication Instructions:  Your physician recommends that you continue on your current medications as directed. Please refer to the Current Medication list given to you today.  *If you need a refill  on your cardiac medications before your next appointment, please call your pharmacy*   Lab Work: NONE If you have labs (blood work) drawn today and your tests are completely normal, you will receive your results only by: MyChart Message (if you have MyChart) OR A paper copy in the mail If you have any lab test that is abnormal or we need to change your treatment, we will call you to  review the results.   Testing/Procedures: Your physician has ordered you to have non-invasive CT for Calcium Scoring of your heart. It will calculate your risk of developing Coronary Artery Disease (CAD) by measuring the amount of buildup of calcium in the plaque in the coronary arteries (arteries surrounding your heart).  This is a self pay test [phone]   Your physician has requested that you have an echocardiogram. Echocardiography is a painless test that uses sound waves to create images of your heart. It provides your doctor with information about the size and shape of your heart and how well your heart's chambers and valves are working. This procedure takes approximately one hour. There are no restrictions for this procedure.   Follow-Up: At [facility], you and your health needs are our priority.  As part of our continuing mission to provide you with exceptional heart care, we have created designated Provider Care Teams.  These Care Teams include your primary Cardiologist (physician) and Advanced Practice Providers (APPs -  Physician Assistants and Nurse Practitioners) who all work together to provide you with the care you need, when you need it.  We recommend signing up for the patient portal called "MyChart".  Sign up information is provided on this After Visit Summary.  MyChart is used to connect with patients for Virtual Visits (Telemedicine).  Patients are able to view lab/test results, encounter notes, upcoming appointments, etc.  Non-urgent messages can be sent to your provider as well.   To learn more  about what you can do with MyChart, go to [email or web address].    Your next appointment:   6 month(s)  The format for your next appointment:   In Person  Provider:   Christell Constant, MD     Important Information About Sugar         Signed, Christell Constant, MD  01/24/2022 2:16 PM    [facility]

## 2022-01-24 ENCOUNTER — Ambulatory Visit: Payer: Federal, State, Local not specified - PPO | Admitting: Internal Medicine

## 2022-01-24 ENCOUNTER — Encounter: Payer: Self-pay | Admitting: Internal Medicine

## 2022-01-24 VITALS — BP 118/80 | HR 63 | Ht 71.0 in | Wt 213.0 lb

## 2022-01-24 DIAGNOSIS — E782 Mixed hyperlipidemia: Secondary | ICD-10-CM | POA: Diagnosis not present

## 2022-01-24 DIAGNOSIS — Z8241 Family history of sudden cardiac death: Secondary | ICD-10-CM

## 2022-01-24 DIAGNOSIS — R011 Cardiac murmur, unspecified: Secondary | ICD-10-CM

## 2022-01-24 NOTE — Patient Instructions (Addendum)
Medication Instructions:  Your physician recommends that you continue on your current medications as directed. Please refer to the Current Medication list given to you today.  *If you need a refill on your cardiac medications before your next appointment, please call your pharmacy*   Lab Work: NONE If you have labs (blood work) drawn today and your tests are completely normal, you will receive your results only by: MyChart Message (if you have MyChart) OR A paper copy in the mail If you have any lab test that is abnormal or we need to change your treatment, we will call you to review the results.   Testing/Procedures: Your physician has ordered you to have non-invasive CT for Calcium Scoring of your heart. It will calculate your risk of developing Coronary Artery Disease (CAD) by measuring the amount of buildup of calcium in the plaque in the coronary arteries (arteries surrounding your heart).  This is a self pay test [phone]   Your physician has requested that you have an echocardiogram. Echocardiography is a painless test that uses sound waves to create images of your heart. It provides your doctor with information about the size and shape of your heart and how well your heart's chambers and valves are working. This procedure takes approximately one hour. There are no restrictions for this procedure.   Follow-Up: At [facility], you and your health needs are our priority.  As part of our continuing mission to provide you with exceptional heart care, we have created designated Provider Care Teams.  These Care Teams include your primary Cardiologist (physician) and Advanced Practice Providers (APPs -  Physician Assistants and Nurse Practitioners) who all work together to provide you with the care you need, when you need it.  We recommend signing up for the patient portal called "MyChart".  Sign up information is provided on this After Visit Summary.  MyChart is used to connect with patients  for Virtual Visits (Telemedicine).  Patients are able to view lab/test results, encounter notes, upcoming appointments, etc.  Non-urgent messages can be sent to your provider as well.   To learn more about what you can do with MyChart, go to [email or web address].    Your next appointment:   6 month(s)  The format for your next appointment:   In Person  Provider:   Christell Constant, MD     Important Information About Sugar

## 2022-01-31 ENCOUNTER — Ambulatory Visit (HOSPITAL_COMMUNITY)
Admission: RE | Admit: 2022-01-31 | Discharge: 2022-01-31 | Disposition: A | Discharge status: Home / Self Care | Payer: Federal, State, Local not specified - PPO | Source: Ambulatory Visit | Attending: Internal Medicine | Admitting: Internal Medicine

## 2022-01-31 DIAGNOSIS — E782 Mixed hyperlipidemia: Secondary | ICD-10-CM | POA: Insufficient documentation

## 2022-01-31 DIAGNOSIS — R011 Cardiac murmur, unspecified: Secondary | ICD-10-CM | POA: Insufficient documentation

## 2022-01-31 DIAGNOSIS — Z8241 Family history of sudden cardiac death: Secondary | ICD-10-CM | POA: Diagnosis present

## 2022-02-04 ENCOUNTER — Telehealth: Payer: Self-pay

## 2022-02-04 DIAGNOSIS — R011 Cardiac murmur, unspecified: Secondary | ICD-10-CM

## 2022-02-04 DIAGNOSIS — E782 Mixed hyperlipidemia: Secondary | ICD-10-CM

## 2022-02-04 NOTE — Telephone Encounter (Signed)
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

## 2022-02-04 NOTE — Telephone Encounter (Signed)
The patient has been notified of the result and verbalized understanding.  All questions (if any) were answered. Theresia Majors, RN 02/04/2022 12:15 PM  Labs have been scheduled.

## 2022-02-13 ENCOUNTER — Ambulatory Visit (HOSPITAL_COMMUNITY): Payer: Federal, State, Local not specified - PPO | Attending: Internal Medicine

## 2022-02-13 ENCOUNTER — Other Ambulatory Visit: Payer: Federal, State, Local not specified - PPO

## 2022-02-13 DIAGNOSIS — R011 Cardiac murmur, unspecified: Secondary | ICD-10-CM | POA: Diagnosis present

## 2022-02-13 DIAGNOSIS — Z8241 Family history of sudden cardiac death: Secondary | ICD-10-CM | POA: Insufficient documentation

## 2022-02-13 DIAGNOSIS — E782 Mixed hyperlipidemia: Secondary | ICD-10-CM

## 2022-02-13 LAB — ECHOCARDIOGRAM COMPLETE
Area-P 1/2: 2.77 cm2
S' Lateral: 3.2 cm

## 2022-02-13 LAB — LIPID PANEL
Chol/HDL Ratio: 3.1 ratio (ref 0.0–5.0)
Cholesterol, Total: 168 mg/dL (ref 100–199)
HDL: 55 mg/dL (ref >39)
LDL Chol Calc (NIH): 97 mg/dL (ref 0–99)
Triglycerides: 86 mg/dL (ref 0–149)
VLDL Cholesterol Cal: 16 mg/dL (ref 5–40)

## 2022-02-15 ENCOUNTER — Telehealth: Payer: Self-pay

## 2022-02-15 DIAGNOSIS — E782 Mixed hyperlipidemia: Secondary | ICD-10-CM

## 2022-02-15 MED ORDER — ROSUVASTATIN CALCIUM 20 MG PO TABS: 20.0000 mg | ORAL_TABLET | Freq: Every day | ORAL | 3 refills | Status: DC

## 2022-02-15 NOTE — Telephone Encounter (Signed)
The patient has been notified of the result and verbalized understanding.  All questions (if any) were answered. Macie Burows, RN 02/15/2022 1:01 PM

## 2022-02-15 NOTE — Telephone Encounter (Signed)
-----   Message from Christell Constant, MD sent at 02/14/2022 11:41 AM EDT ----- Results: Normal study, mild aortic valve calcification LDL 97 Plan: Increase statin doe from 10 to 20, watch for myalgias, if no issues will continue will recheck at December visit  Christell Constant, MD

## 2022-07-08 ENCOUNTER — Ambulatory Visit: Payer: Federal, State, Local not specified - PPO | Attending: Internal Medicine

## 2022-07-08 DIAGNOSIS — E782 Mixed hyperlipidemia: Secondary | ICD-10-CM

## 2022-07-09 LAB — ALT: ALT: 15 IU/L (ref 0–44)

## 2022-07-09 LAB — LIPID PANEL
Chol/HDL Ratio: 2.5 ratio (ref 0.0–5.0)
Cholesterol, Total: 146 mg/dL (ref 100–199)
HDL: 59 mg/dL (ref >39)
LDL Chol Calc (NIH): 70 mg/dL (ref 0–99)
Triglycerides: 92 mg/dL (ref 0–149)
VLDL Cholesterol Cal: 17 mg/dL (ref 5–40)

## 2022-07-09 NOTE — Progress Notes (Unsigned)
Cardiology Office Note:    Date:  07/10/2022   ID:  [name], DOB [birth year], MRN [id number]  PCP:  Lawerance Cruel, MD   [facility] Cardiologist:  Werner Lean, MD     Referring MD: Lawerance Cruel, MD   CC: F/u CAC  History of Present Illness:    [name] is a 76 y.o. male with a hx of Fhx of HF, HLD who presents for evaluation 01/24/22. 2023:Found to have aortic atherosclerosis, aortic sclerosis, and mild CAC.  Increased statin dose.  Patient notes that he is doing well.   There are no interval hospital/ED visit.    No chest pain or pressure .  No SOB/DOE and no PND/Orthopnea.  No weight gain or leg swelling.  No palpitations or syncope .  On the higher dose of the crestor; no symptoms.  Past Medical History:  Diagnosis Date   Abnormal CBC    Diverticulosis    Fatigue    Flat feet    GERD (gastroesophageal reflux disease)    High cholesterol    History of prostate cancer    Hx of adenomatous colonic polyps    Hyperlipidemia     No past surgical history on file.  Current Medications: Current Meds  Medication Sig   calcium carbonate (TUMS) 500 MG chewable tablet Chew 1 tablet by mouth daily.   cholecalciferol (VITAMIN D) 25 MCG (1000 UT) tablet Take 1 tablet (1,000 Units total) by mouth daily.   Coenzyme Q10 (CO Q 10 PO) Take 200 mg by mouth every morning.   COLLAGEN-BORON-HYALURONIC ACID PO Take by mouth daily.   famotidine-calcium carbonate-magnesium hydroxide (PEPCID COMPLETE) 10-800-165 MG chewable tablet Chew 1 tablet by mouth 2 (two) times daily as needed.   fexofenadine (ALLEGRA) 180 MG tablet Take 1 tablet (180 mg total) by mouth daily.   fish oil-omega-3 fatty acids 1000 MG capsule Take 1 g by mouth 2 (two) times daily. Take 1 tablet in the morning and take 2 tablets at night.   Multiple Vitamins-Minerals (SYSTANE ICAPS AREDS2) TABS Take 2 tablets by mouth daily.   rosuvastatin (CRESTOR) 20 MG tablet Take 1 tablet  (20 mg total) by mouth daily.     Allergies:   Grass pollen(k-o-r-t-swt vern) and Other   Social History   Socioeconomic History   Marital status: Married    Spouse name: Not on file   Number of children: Not on file   Years of education: Not on file   Highest education level: Not on file  Occupational History   Not on file  Tobacco Use   Smoking status: Former    Types: Cigarettes    Quit date: 2003    Years since quitting: 20.9   Smokeless tobacco: Never  Substance and Sexual Activity   Alcohol use: Yes   Drug use: Not on file   Sexual activity: Not on file  Other Topics Concern   Not on file  Social History Narrative   Not on file   Social Determinants of Health   Financial Resource Strain: Not on file  Food Insecurity: Not on file  Transportation Needs: Not on file  Physical Activity: Not on file  Stress: Not on file  Social Connections: Not on file     Family History: The patient's family history includes Heart failure in his brother and father; Liver disease in his brother. Brother died in his sleep in the setting of getting puffy.  Died in his easy chair. Father  had prior valve surgery and open heart surgery.  May have had amiodarone induced pulmonary toxicity.  ROS:   Please see the history of present illness.     All other systems reviewed and are negative.  EKGs/Labs/Other Studies Reviewed:    The following studies were reviewed today:  EKG:   01/24/22: NSR rate 62  Cardiac Studies & Procedures       ECHOCARDIOGRAM  ECHOCARDIOGRAM COMPLETE 02/13/2022  Narrative ECHOCARDIOGRAM REPORT    Patient Name:   [name] Date of Exam: 02/13/2022 Medical Rec #:  [id number]     Height:       71.0 in Accession #:    [id number]    Weight:       213.0 lb Date of Birth:  20-Apr-1946    BSA:          2.166 m Patient Age:    47 years      BP:           140/79 mmHg Patient Gender: M             HR:           69 bpm. Exam Location:  [place]  Procedure: 2D Echo, 3D Echo, Cardiac Doppler and Color Doppler  Indications:    R01.1 Murmur  History:        Patient has no prior history of Echocardiogram examinations. Risk Factors:HLD. Family hx of sudden cardiac death.  Sonographer:    Marygrace Drought RCS Referring Phys: [id number] Iron A Quintrell Baze  IMPRESSIONS   1. Left ventricular ejection fraction, by estimation, is 60 to 65%. The left ventricle has normal function. The left ventricle has no regional wall motion abnormalities. There is mild left ventricular hypertrophy. Left ventricular diastolic parameters are consistent with Grade I diastolic dysfunction (impaired relaxation). 2. Right ventricular systolic function is normal. The right ventricular size is normal. Tricuspid regurgitation signal is inadequate for assessing PA pressure. 3. The mitral valve is normal in structure. No evidence of mitral valve regurgitation. 4. The aortic valve is tricuspid. There is mild calcification of the aortic valve. Aortic valve regurgitation is not visualized. 5. The inferior vena cava is normal in size with greater than 50% respiratory variability, suggesting right atrial pressure of 3 mmHg.  Comparison(s): No prior Echocardiogram.  FINDINGS Left Ventricle: Left ventricular ejection fraction, by estimation, is 60 to 65%. The left ventricle has normal function. The left ventricle has no regional wall motion abnormalities. The left ventricular internal cavity size was normal in size. There is mild left ventricular hypertrophy. Left ventricular diastolic parameters are consistent with Grade I diastolic dysfunction (impaired relaxation).  Right Ventricle: The right ventricular size is normal. No increase in right ventricular wall thickness. Right ventricular systolic function is normal. Tricuspid regurgitation signal is inadequate for assessing PA pressure.  Left Atrium: Left atrial size was normal in size.  Right Atrium: Right  atrial size was normal in size.  Pericardium: There is no evidence of pericardial effusion.  Mitral Valve: The mitral valve is normal in structure. No evidence of mitral valve regurgitation.  Tricuspid Valve: The tricuspid valve is normal in structure. Tricuspid valve regurgitation is trivial.  Aortic Valve: The aortic valve is tricuspid. There is mild calcification of the aortic valve. Aortic valve regurgitation is not visualized.  Pulmonic Valve: The pulmonic valve was grossly normal. Pulmonic valve regurgitation is not visualized.  Aorta: The aortic root and ascending aorta are structurally normal, with no evidence of dilitation.  Venous: The inferior vena cava is normal in size with greater than 50% respiratory variability, suggesting right atrial pressure of 3 mmHg.  IAS/Shunts: No atrial level shunt detected by color flow Doppler.   LEFT VENTRICLE PLAX 2D LVIDd:         4.50 cm   Diastology LVIDs:         3.20 cm   LV e' medial:    8.81 cm/s LV PW:         1.10 cm   LV E/e' medial:  8.0 LV IVS:        1.10 cm   LV e' lateral:   9.90 cm/s LVOT diam:     2.00 cm   LV E/e' lateral: 7.1 LV SV:         73 LV SV Index:   34 LVOT Area:     3.14 cm  3D Volume EF: 3D EF:        62 % LV EDV:       118 ml LV ESV:       45 ml LV SV:        73 ml  RIGHT VENTRICLE RV Basal diam:  3.70 cm RV S prime:     15.60 cm/s TAPSE (M-mode): 2.2 cm  LEFT ATRIUM             Index        RIGHT ATRIUM           Index LA diam:        3.40 cm 1.57 cm/m   RA Pressure: 3.00 mmHg LA Vol (A2C):   34.8 ml 16.07 ml/m  RA Area:     20.50 cm LA Vol (A4C):   35.3 ml 16.30 ml/m  RA Volume:   60.60 ml  27.98 ml/m LA Biplane Vol: 35.4 ml 16.35 ml/m AORTIC VALVE LVOT Vmax:   116.00 cm/s LVOT Vmean:  78.100 cm/s LVOT VTI:    0.232 m  AORTA Ao Root diam: 3.50 cm Ao Asc diam:  3.70 cm  MITRAL VALVE               TRICUSPID VALVE MV Area (PHT):             Estimated RAP:  3.00 mmHg MV Decel  Time: MV E velocity: 70.70 cm/s  SHUNTS MV A velocity: 78.60 cm/s  Systemic VTI:  0.23 m MV E/A ratio:  0.90        Systemic Diam: 2.00 cm  Phineas Inches Electronically signed by Phineas Inches Signature Date/Time: 02/13/2022/1:51:36 PM    Final     CT SCANS  CT CARDIAC SCORING (SELF PAY ONLY) 02/01/2022  Addendum 02/01/2022  9:47 AM ADDENDUM REPORT: 02/01/2022 09:45  ADDENDUM: The following report is an over-read performed by radiologist Dr. Dahlia Bailiff of [facility] on February 01, 2022. This over-read does not include interpretation of cardiac or coronary anatomy or pathology. The coronary calcium score interpretation by the cardiologist is attached.  COMPARISON:  None.  FINDINGS: Vascular: No acute non-cardiac vascular finding. Aortic atherosclerosis.  Mediastinum/Nodes: Within the visualized portions of the thorax there is no pathologically enlarged mediastinal, or hilar lymph nodes, noting limited sensitivity for the detection of hilar adenopathy on this noncontrast study. Moderate-sized hiatal hernia.  Lungs/Pleura: Right upper lobe perifissural pulmonary nodule measuring 4 mm on image 31/5. Within the visualized portions of the thorax there is no acute consolidative airspace disease, no pleural effusions and no pneumothorax.  Upper Abdomen:  Visualized portions of the upper abdomen are unremarkable.  Musculoskeletal: There are no aggressive appearing lytic or blastic lesions noted in the visualized portions of the skeleton.  IMPRESSION: 1. Right upper lobe 4 mm perifissural pulmonary nodule, consistent with an intrapulmonary lymph node within the upper lobe. No routine follow-up imaging is recommended per Fleischner Society Guidelines. These guidelines do not apply to immunocompromised patients and patients with cancer. Follow up in patients with significant comorbidities as clinically warranted. For lung cancer screening, adhere to Lung-RADS  guidelines. Reference: Radiology. 2017; 284(1):228-43; Radiology. 2012; 265(2):611-6; Radiology. 2010; 254(3):949-56. 2.  Aortic Atherosclerosis (ICD10-I70.0).   Electronically Signed By: Dahlia Bailiff M.D. On: 02/01/2022 09:45  Narrative CLINICAL DATA:  Cardiovascular Disease Risk stratification  EXAM: Coronary Calcium Score  TECHNIQUE: A gated, non-contrast computed tomography scan of the heart was performed using 66mm slice thickness. Axial images were analyzed on a dedicated workstation. Calcium scoring of the coronary arteries was performed using the Agatston method.  FINDINGS: Coronary arteries: Normal origins.  Coronary Calcium Score:  Left main: 0  Left anterior descending artery: 58.1  Left circumflex artery: 0  Right coronary artery: 158  Total: 216  Percentile: 43rd  Pericardium: Normal.  Aorta: Normal caliber.  Aortic atherosclerosis.  Non-cardiac: See separate report from [facility].  IMPRESSION: 1. Coronary calcium score of 216. This was 43rd percentile for age-, race-, and sex-matched controls. 2. Aortic atherosclerosis.  RECOMMENDATIONS: Coronary artery calcium (CAC) score is a strong predictor of incident coronary heart disease (CHD) and provides predictive information beyond traditional risk factors. CAC scoring is reasonable to use in the decision to withhold, postpone, or initiate statin therapy in intermediate-risk or selected borderline-risk asymptomatic adults (age 45-75 years and LDL-C >=70 to <190 mg/dL) who do not have diabetes or established atherosclerotic cardiovascular disease (ASCVD).* In intermediate-risk (10-year ASCVD risk >=7.5% to <20%) adults or selected borderline-risk (10-year ASCVD risk >=5% to <7.5%) adults in whom a CAC score is measured for the purpose of making a treatment decision the following recommendations have been made:  If CAC=0, it is reasonable to withhold statin therapy and reassess in 5  to 10 years, as long as higher risk conditions are absent (diabetes mellitus, family history of premature CHD in first degree relatives (males <55 years; females <65 years), cigarette smoking, or LDL >=190 mg/dL).  If CAC is 1 to 99, it is reasonable to initiate statin therapy for patients >=60 years of age.  If CAC is >=100 or >=75th percentile, it is reasonable to initiate statin therapy at any age.  Cardiology referral should be considered for patients with CAC scores >=400 or >=75th percentile.  *2018 AHA/ACC/AACVPR/AAPA/ABC/ACPM/ADA/AGS/APhA/ASPC/NLA/PCNA Guideline on the Management of Blood Cholesterol: A Report of the American College of Cardiology/American Heart Association Task Force on Clinical Practice Guidelines. J Am Coll Cardiol. 2019;73(24):3168-3209.  Lyman Bishop, MD  Electronically Signed: By: Pixie Casino M.D. On: 01/31/2022 18:56           Recent Labs: 07/08/2022: ALT 15  Recent Lipid Panel    Component Value Date/Time   CHOL 146 07/08/2022 0925   TRIG 92 07/08/2022 0925   HDL 59 07/08/2022 0925   CHOLHDL 2.5 07/08/2022 0925   LDLCALC 70 07/08/2022 0925         Physical Exam:    VS:  BP 132/70   Pulse 64   Ht 6' (1.829 m)   Wt 225 lb (102.1 kg)   SpO2 96%   BMI 30.52 kg/m  Wt Readings from Last 3 Encounters:  07/10/22 225 lb (102.1 kg)  01/24/22 213 lb (96.6 kg)  04/01/19 213 lb 11.2 oz (96.9 kg)    Gen: no distress   Neck: No JVD  Ears: R ear Frank Sign Cardiac: No Rubs or Gallops, systolic crescendo murmur, RRR +2 radial pulses Respiratory: Clear to auscultation bilaterally, normal effort, normal  respiratory rate GI: Soft, nontender, non-distended  MS: No edema; moves all extremities Integument: Skin feels warm Neuro:  At time of evaluation, alert and oriented to person/place/time/situation  Psych: Normal affect, patient feels well   ASSESSMENT:    1. Hyperlipidemia, unspecified hyperlipidemia type   2. Aortic  atherosclerosis (HCC)     PLAN:    CAC Aortic athersclerosis HLD - LDL was at goal with stable live - discussed diet changes  Aortic sclerosis and systolic murmur FH of SCD death - monitor  Fatigue - he has very minor fatigue, if progressive we have consented him for exercise stress testing  One year with me  Medication Adjustments/Labs and Tests Ordered: Current medicines are reviewed at length with the patient today.  Concerns regarding medicines are outlined above.  No orders of the defined types were placed in this encounter.  No orders of the defined types were placed in this encounter.   Patient Instructions  Medication Instructions:  Your physician recommends that you continue on your current medications as directed. Please refer to the Current Medication list given to you today.  *If you need a refill on your cardiac medications before your next appointment, please call your pharmacy*   Lab Work: LDL direct, Lpa, ALT If you have labs (blood work) drawn today and your tests are completely normal, you will receive your results only by: MyChart Message (if you have MyChart) OR A paper copy in the mail If you have any lab test that is abnormal or we need to change your treatment, we will call you to review the results.   Follow-Up: At [facility], you and your health needs are our priority.  As part of our continuing mission to provide you with exceptional heart care, we have created designated Provider Care Teams.  These Care Teams include your primary Cardiologist (physician) and Advanced Practice Providers (APPs -  Physician Assistants and Nurse Practitioners) who all work together to provide you with the care you need, when you need it.   Your next appointment:   1 year(s)  The format for your next appointment:   In Person  Provider:   Christell Constant, MD     Important Information About Sugar         Signed, Christell Constant, MD  07/10/2022 2:10 PM    [facility]

## 2022-07-10 ENCOUNTER — Ambulatory Visit: Payer: Federal, State, Local not specified - PPO | Attending: Internal Medicine | Admitting: Internal Medicine

## 2022-07-10 ENCOUNTER — Encounter: Payer: Self-pay | Admitting: Internal Medicine

## 2022-07-10 VITALS — BP 132/70 | HR 64 | Ht 72.0 in | Wt 225.0 lb

## 2022-07-10 DIAGNOSIS — I2584 Coronary atherosclerosis due to calcified coronary lesion: Secondary | ICD-10-CM

## 2022-07-10 DIAGNOSIS — I2510 Atherosclerotic heart disease of native coronary artery without angina pectoris: Secondary | ICD-10-CM | POA: Diagnosis not present

## 2022-07-10 DIAGNOSIS — I700 Atherosclerosis of aorta: Secondary | ICD-10-CM

## 2022-07-10 DIAGNOSIS — E785 Hyperlipidemia, unspecified: Secondary | ICD-10-CM | POA: Insufficient documentation

## 2022-07-10 NOTE — Patient Instructions (Signed)
Medication Instructions:  Your physician recommends that you continue on your current medications as directed. Please refer to the Current Medication list given to you today.  *If you need a refill on your cardiac medications before your next appointment, please call your pharmacy*   Lab Work: LDL direct, Lpa, ALT If you have labs (blood work) drawn today and your tests are completely normal, you will receive your results only by: MyChart Message (if you have MyChart) OR A paper copy in the mail If you have any lab test that is abnormal or we need to change your treatment, we will call you to review the results.   Follow-Up: At [facility], you and your health needs are our priority.  As part of our continuing mission to provide you with exceptional heart care, we have created designated Provider Care Teams.  These Care Teams include your primary Cardiologist (physician) and Advanced Practice Providers (APPs -  Physician Assistants and Nurse Practitioners) who all work together to provide you with the care you need, when you need it.   Your next appointment:   1 year(s)  The format for your next appointment:   In Person  Provider:   Christell Constant, MD     Important Information About Sugar

## 2022-08-21 IMAGING — CR DG ELBOW COMPLETE 3+V*R*
4 series · 4 of 4 positions shown · non-contrast
Comparison: None.

CLINICAL DATA: Fall with abrasion

EXAM:
RIGHT ELBOW - COMPLETE 3+ VIEW

[x elbow ap right]
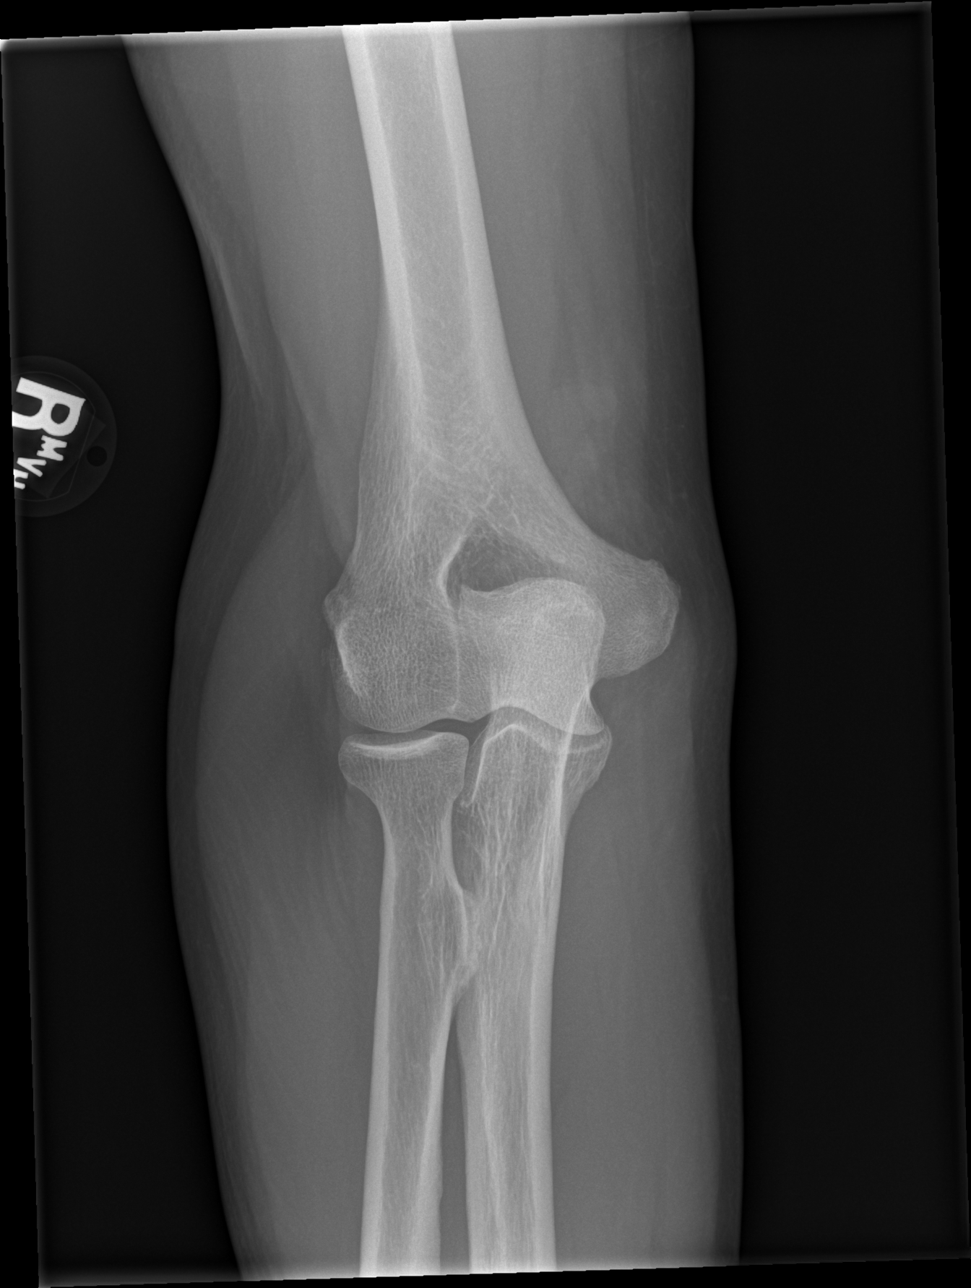

[x elbow obl right (1 of 2)]
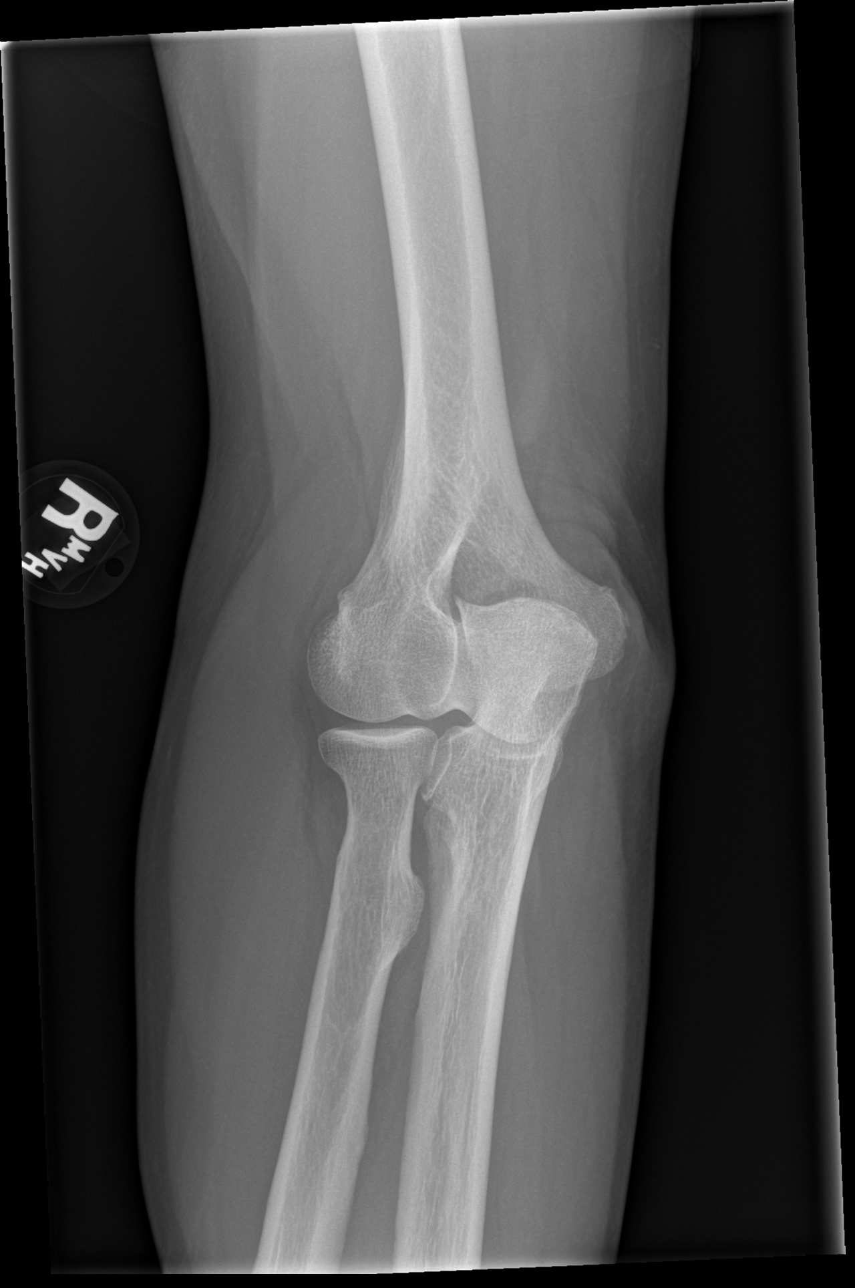

[x elbow obl right (2 of 2)]
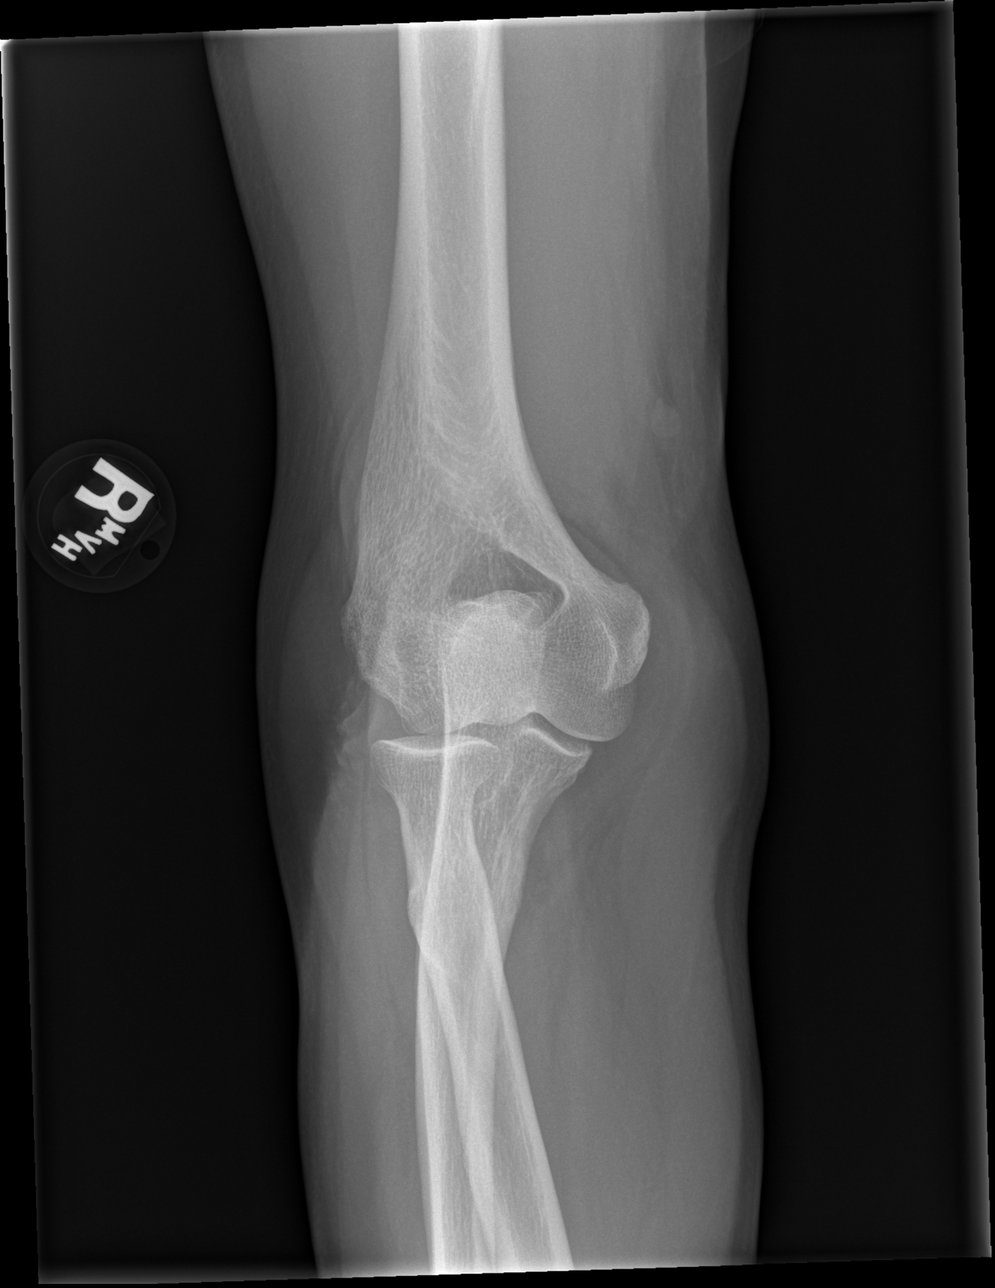

[x elbow lat right]
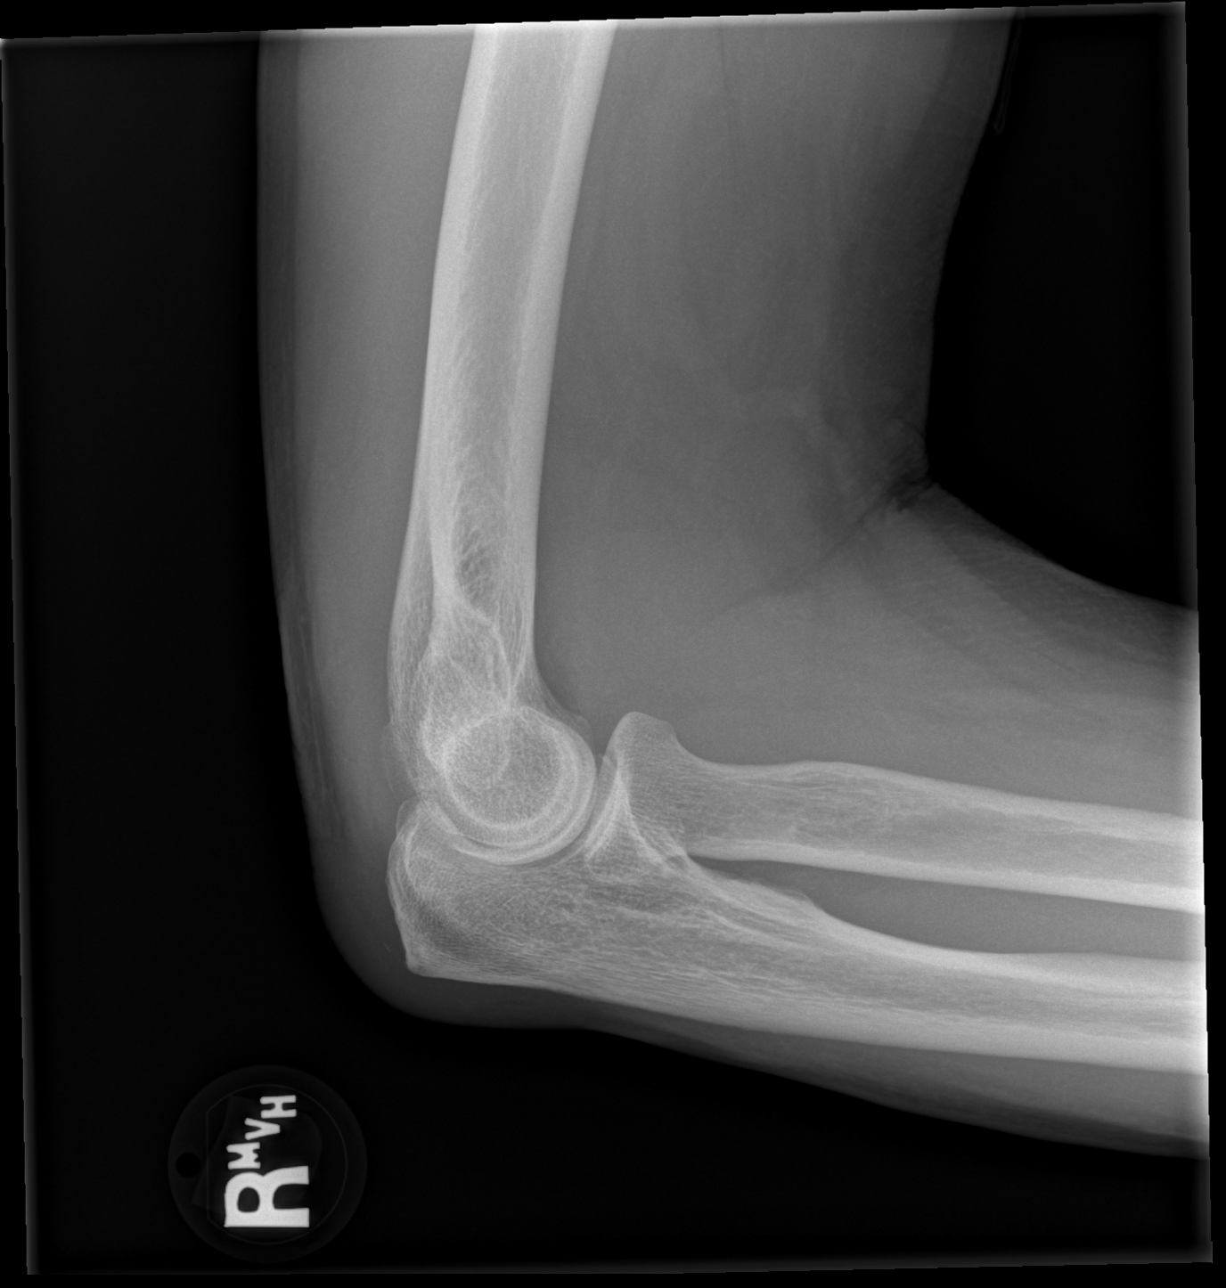

[4 of 4 positions shown; findings below may reference images not displayed]

FINDINGS: There is no evidence of fracture, dislocation, or joint effusion.
There is no evidence of arthropathy or other focal bone abnormality.
Soft tissues are unremarkable.
IMPRESSION: Negative.

## 2022-08-21 IMAGING — CT CT CERVICAL SPINE W/O CM
3 series · 11 of 33 positions shown, 13 images · non-contrast
Comparison: None.

CLINICAL DATA: Fall with laceration

EXAM:
CT HEAD WITHOUT CONTRAST
CT CERVICAL SPINE WITHOUT CONTRAST
TECHNIQUE: Multidetector CT imaging of the head and cervical spine was
performed following the standard protocol without intravenous
contrast. Multiplanar CT image reconstructions of the cervical spine
were also generated.

[Series 4: c spine soft · axial · 0.31mm/px · z∈[-290,-162]mm · 3 of 105 slices shown, 4 images]
[im 25/105  soft-tissue]
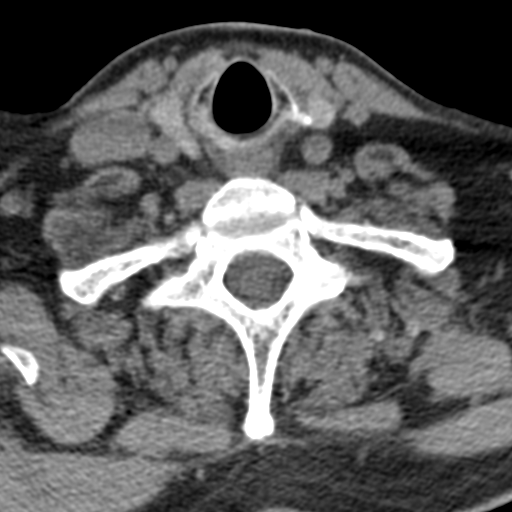
[im 25/105  bone]
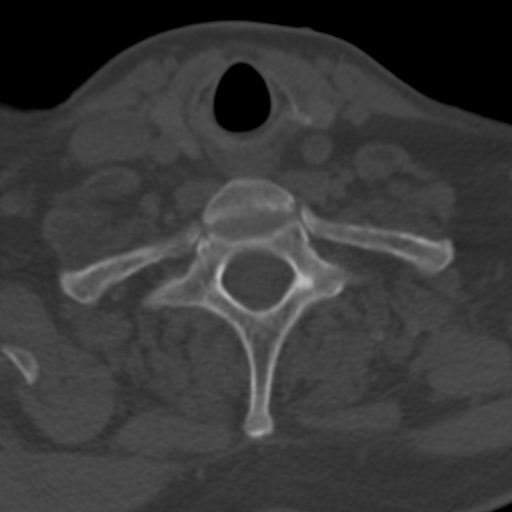
[im 57/105  bone]
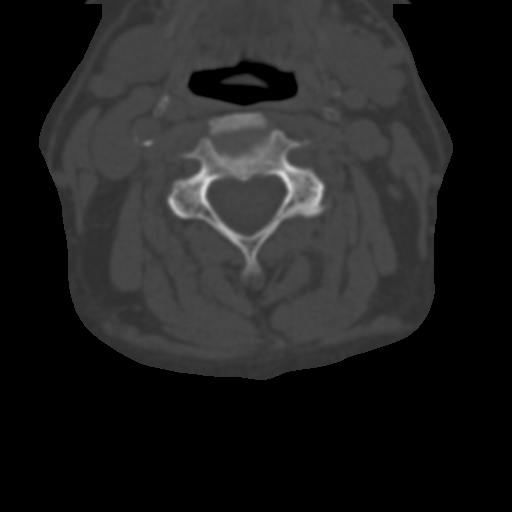
[im 89/105  bone]
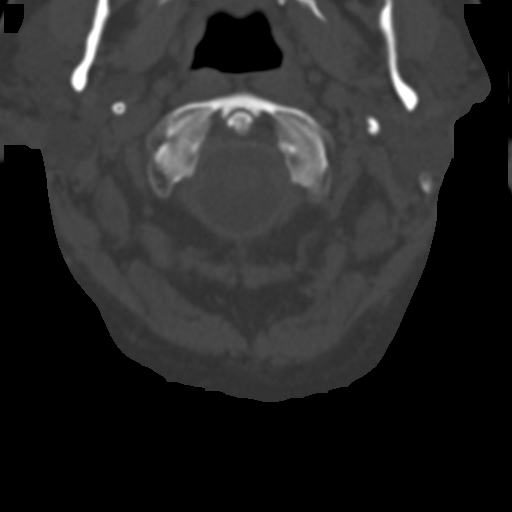

[Series 7: coronal bone · coronal · 0.23mm/px · 3 of 61 slices shown]
[im 13/61  bone]
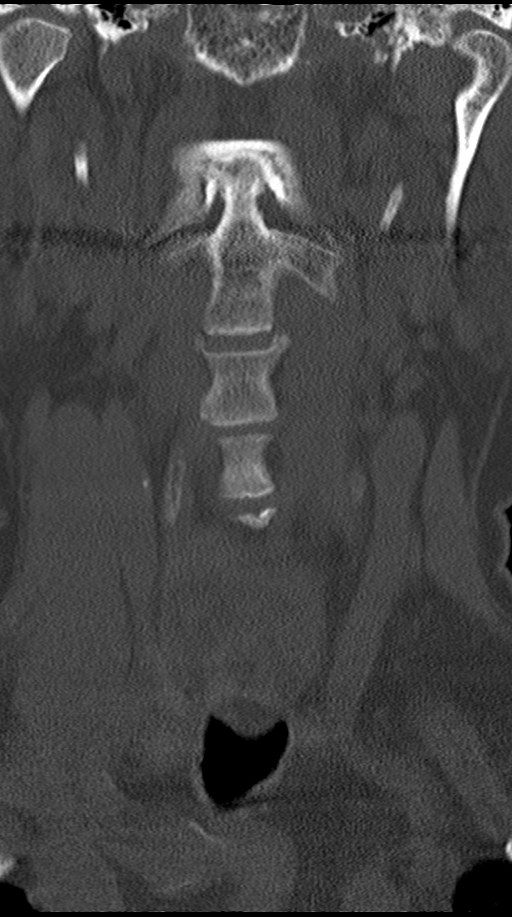
[im 25/61  bone]
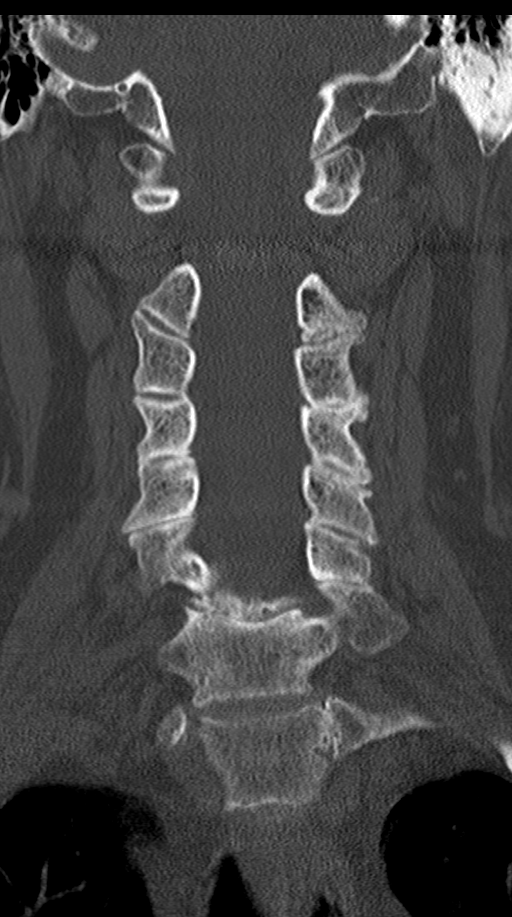
[im 37/61  bone]
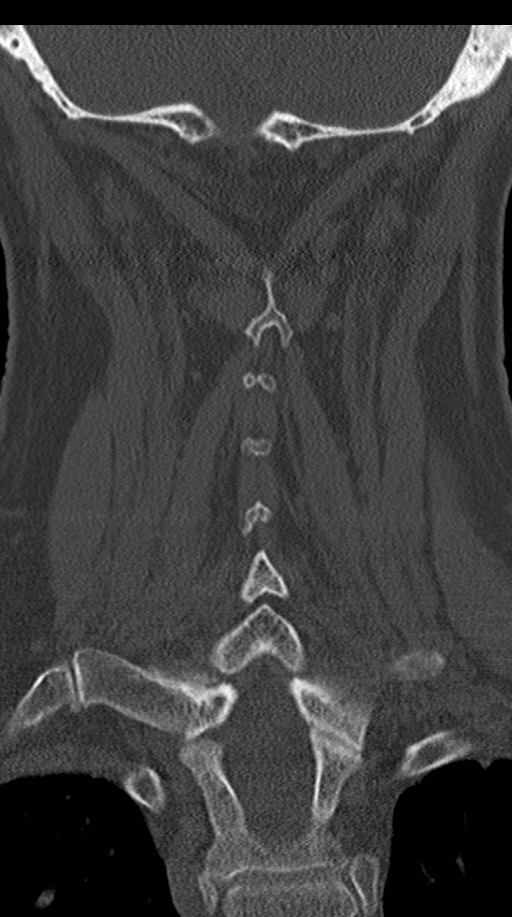

[Series 8: sagittal bone · sagittal · 0.23mm/px · 5 of 61 slices shown, 6 images]
[im 21/61  bone]
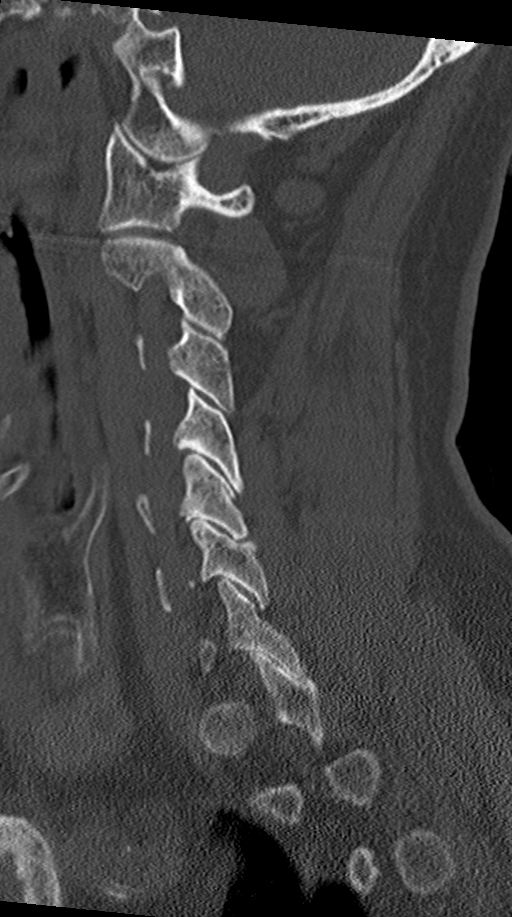
[im 26/61  bone]
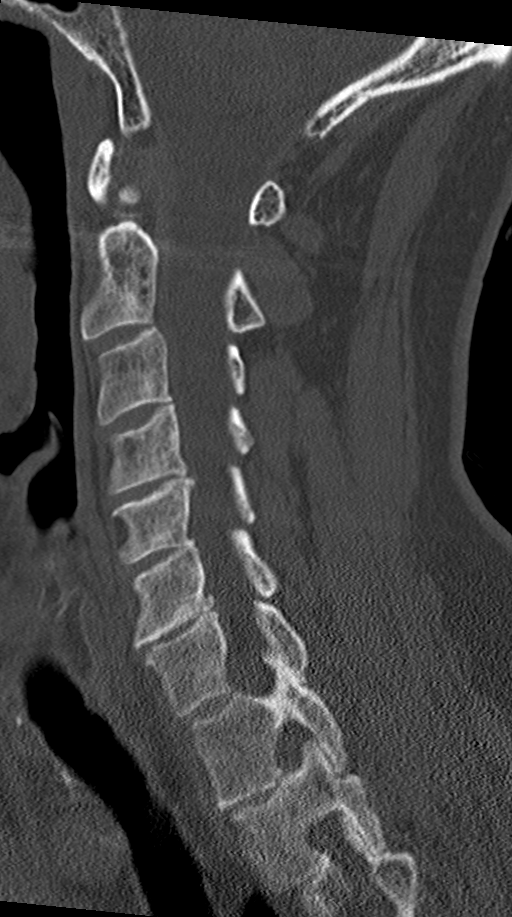
[im 31/61  soft-tissue]
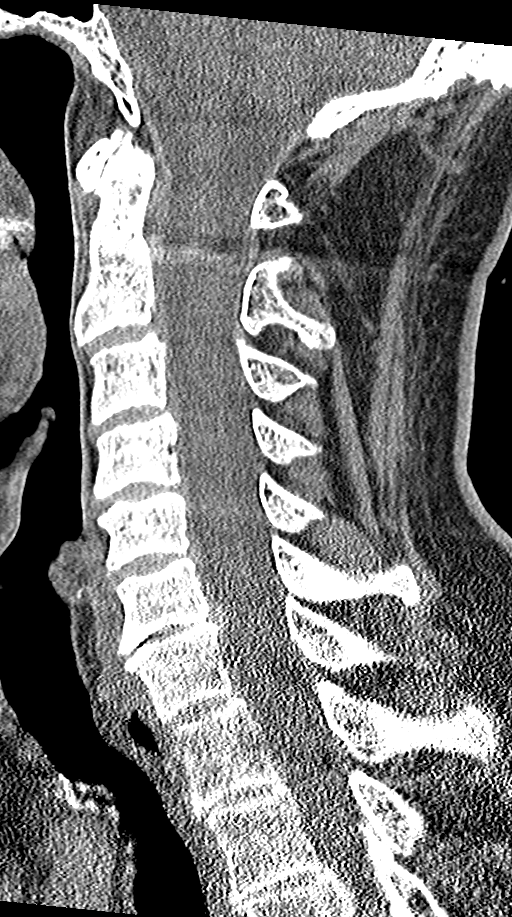
[im 31/61  bone]
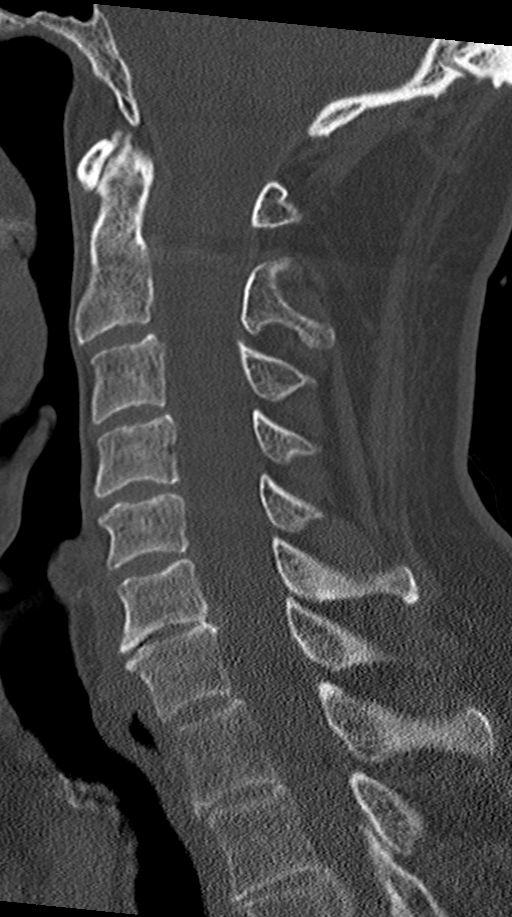
[im 36/61  bone]
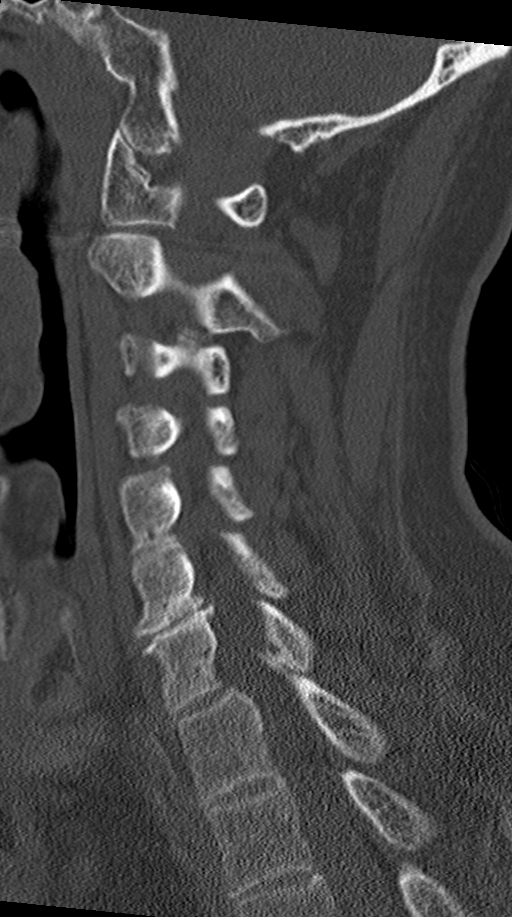
[im 41/61  bone]
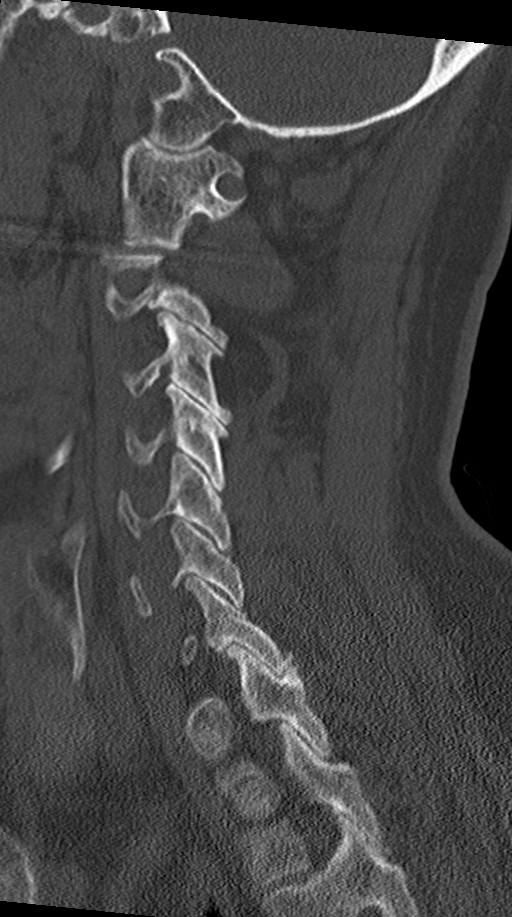

[11 of 33 positions shown; findings below may reference images not displayed]

FINDINGS: CT HEAD FINDINGS

Brain: No acute territorial infarction, hemorrhage or intracranial
mass. Moderate atrophy. Mild hypodensity in the white matter
consistent with chronic small vessel ischemic change. The ventricles
are nonenlarged.

Vascular: No hyperdense vessels.  No unexpected calcification

Skull: Normal. Negative for fracture or focal lesion.

Sinuses/Orbits: No acute finding.

Other: Small scalp laceration at the posterior vertex

CT CERVICAL SPINE FINDINGS

Alignment: No subluxation.  Facet alignment is within normal limits.

Skull base and vertebrae: No acute fracture. No primary bone lesion
or focal pathologic process.

Soft tissues and spinal canal: No prevertebral fluid or swelling. No
visible canal hematoma.

Disc levels: Mild degenerative change at C4-C5 with moderate
degenerative change at C6-C7. Mild facet degenerative change at
multiple levels.

Upper chest: Negative.

Other: None
IMPRESSION: 1. No CT evidence for acute intracranial abnormality. Atrophy and
mild chronic small vessel ischemic change of the white matter.
2. Degenerative changes of the cervical spine. No acute osseous
abnormality.

## 2023-03-08 ENCOUNTER — Other Ambulatory Visit: Payer: Self-pay | Admitting: Internal Medicine

## 2023-07-08 ENCOUNTER — Encounter: Payer: Self-pay | Admitting: Internal Medicine

## 2023-07-08 ENCOUNTER — Ambulatory Visit: Payer: Federal, State, Local not specified - PPO | Attending: Internal Medicine | Admitting: Internal Medicine

## 2023-07-08 VITALS — BP 140/90 | HR 61 | Ht 72.0 in | Wt 220.0 lb

## 2023-07-08 DIAGNOSIS — E785 Hyperlipidemia, unspecified: Secondary | ICD-10-CM

## 2023-07-08 DIAGNOSIS — R011 Cardiac murmur, unspecified: Secondary | ICD-10-CM

## 2023-07-08 DIAGNOSIS — R030 Elevated blood-pressure reading, without diagnosis of hypertension: Secondary | ICD-10-CM

## 2023-07-08 DIAGNOSIS — I700 Atherosclerosis of aorta: Secondary | ICD-10-CM | POA: Diagnosis not present

## 2023-07-08 DIAGNOSIS — I2510 Atherosclerotic heart disease of native coronary artery without angina pectoris: Secondary | ICD-10-CM

## 2023-07-08 NOTE — Progress Notes (Signed)
Cardiology Office Note:    Date:  07/08/2023   ID:  [name], DOB May 26, 1946, MRN [id number]  PCP:  Daisy Floro, MD   [facility] Providers Cardiologist:  Christell Constant, MD     Referring MD: Daisy Floro, MD   CC: F/u CAC  History of Present Illness:    [name] is a 77 y.o. male with a hx of Fhx of HF, HLD who presents for evaluation 01/24/22. 2023:Found to have aortic atherosclerosis, aortic sclerosis, and mild CAC.  Increased statin dose.  [name], a 77 year old individual with a history of coronary artery calcifications, aortic atherosclerosis, and aortic sclerosis, has been managing well on an increased statin dose. The patient's LDL was last recorded at 70. He has been asymptomatic with no reported chest pain or breathing issues. However, he has noted some mild fatigue, which he attributes to a low testosterone level.  The patient has been active, engaging in yard work and other physical activities. He has not been experiencing any symptoms of coronary disease. However, he has reported fluctuations in his blood pressure, which he has noticed during visits to the dentist, suggesting possible white coat hypertension.  The patient's cholesterol levels have been well-controlled and stable. He has a history of calcium buildup on his aortic valve but has not reported any symptoms suggestive of aortic stenosis.  Past Medical History:  Diagnosis Date   Abnormal CBC    Diverticulosis    Fatigue    Flat feet    GERD (gastroesophageal reflux disease)    High cholesterol    History of prostate cancer    Hx of adenomatous colonic polyps    Hyperlipidemia     No past surgical history on file.  Current Medications: Current Meds  Medication Sig   augmented betamethasone dipropionate (DIPROLENE-AF) 0.05 % cream Apply topically as needed (rash).   calcium carbonate (TUMS) 500 MG chewable tablet Chew 1 tablet by mouth daily.   cholecalciferol  (VITAMIN D) 25 MCG (1000 UT) tablet Take 1 tablet (1,000 Units total) by mouth daily.   Coenzyme Q10 (CO Q 10 PO) Take 200 mg by mouth every morning.   COLLAGEN-BORON-HYALURONIC ACID PO Take by mouth daily.   famotidine-calcium carbonate-magnesium hydroxide (PEPCID COMPLETE) 10-800-165 MG chewable tablet Chew 1 tablet by mouth 2 (two) times daily as needed.   fexofenadine (ALLEGRA) 180 MG tablet Take 1 tablet (180 mg total) by mouth daily.   fish oil-omega-3 fatty acids 1000 MG capsule Take 1 g by mouth 2 (two) times daily. Take 1 tablet in the morning and take 2 tablets at night.   Multiple Vitamins-Minerals (SYSTANE ICAPS AREDS2) TABS Take 2 tablets by mouth daily.   rosuvastatin (CRESTOR) 20 MG tablet TAKE ONE TABLET BY MOUTH ONCE DAILY     Allergies:   Grass pollen(k-o-r-t-swt vern) and Other   Social History   Socioeconomic History   Marital status: Married    Spouse name: Not on file   Number of children: Not on file   Years of education: Not on file   Highest education level: Not on file  Occupational History   Not on file  Tobacco Use   Smoking status: Former    Current packs/day: 0.00    Types: Cigarettes    Quit date: 2003    Years since quitting: 21.9   Smokeless tobacco: Never  Substance and Sexual Activity   Alcohol use: Yes   Drug use: Not on file   Sexual activity: Not  on file  Other Topics Concern   Not on file  Social History Narrative   Not on file   Social Determinants of Health   Financial Resource Strain: Not on file  Food Insecurity: Not on file  Transportation Needs: Not on file  Physical Activity: Not on file  Stress: Not on file  Social Connections: Not on file     Family History: The patient's family history includes Heart failure in his brother and father; Liver disease in his brother. Brother died in his sleep in the setting of getting puffy.  Died in his easy chair. Father had prior valve surgery and open heart surgery.  May have had  amiodarone induced pulmonary toxicity.  ROS:   Please see the history of present illness.     EKGs/Labs/Other Studies Reviewed:    The following studies were reviewed today:  EKG:   01/24/22: NSR rate 62  Cardiac Studies & Procedures       ECHOCARDIOGRAM  ECHOCARDIOGRAM COMPLETE 02/13/2022  Narrative ECHOCARDIOGRAM REPORT    Patient Name:   [name] Date of Exam: 02/13/2022 Medical Rec #:  [id number]     Height:       71.0 in Accession #:    [id number]    Weight:       213.0 lb Date of Birth:  12-29-1945    BSA:          2.166 m Patient Age:    75 years      BP:           140/79 mmHg Patient Gender: M             HR:           69 bpm. Exam Location:  [address]  Procedure: 2D Echo, 3D Echo, Cardiac Doppler and Color Doppler  Indications:    R01.1 Murmur  History:        Patient has no prior history of Echocardiogram examinations. Risk Factors:HLD. Family hx of sudden cardiac death.  Sonographer:    Clearence Ped RCS Referring Phys: [id number] Jalei Shibley A Chaundra Abreu  IMPRESSIONS   1. Left ventricular ejection fraction, by estimation, is 60 to 65%. The left ventricle has normal function. The left ventricle has no regional wall motion abnormalities. There is mild left ventricular hypertrophy. Left ventricular diastolic parameters are consistent with Grade I diastolic dysfunction (impaired relaxation). 2. Right ventricular systolic function is normal. The right ventricular size is normal. Tricuspid regurgitation signal is inadequate for assessing PA pressure. 3. The mitral valve is normal in structure. No evidence of mitral valve regurgitation. 4. The aortic valve is tricuspid. There is mild calcification of the aortic valve. Aortic valve regurgitation is not visualized. 5. The inferior vena cava is normal in size with greater than 50% respiratory variability, suggesting right atrial pressure of 3 mmHg.  Comparison(s): No prior Echocardiogram.  FINDINGS Left  Ventricle: Left ventricular ejection fraction, by estimation, is 60 to 65%. The left ventricle has normal function. The left ventricle has no regional wall motion abnormalities. The left ventricular internal cavity size was normal in size. There is mild left ventricular hypertrophy. Left ventricular diastolic parameters are consistent with Grade I diastolic dysfunction (impaired relaxation).  Right Ventricle: The right ventricular size is normal. No increase in right ventricular wall thickness. Right ventricular systolic function is normal. Tricuspid regurgitation signal is inadequate for assessing PA pressure.  Left Atrium: Left atrial size was normal in size.  Right Atrium: Right atrial size was normal  in size.  Pericardium: There is no evidence of pericardial effusion.  Mitral Valve: The mitral valve is normal in structure. No evidence of mitral valve regurgitation.  Tricuspid Valve: The tricuspid valve is normal in structure. Tricuspid valve regurgitation is trivial.  Aortic Valve: The aortic valve is tricuspid. There is mild calcification of the aortic valve. Aortic valve regurgitation is not visualized.  Pulmonic Valve: The pulmonic valve was grossly normal. Pulmonic valve regurgitation is not visualized.  Aorta: The aortic root and ascending aorta are structurally normal, with no evidence of dilitation.  Venous: The inferior vena cava is normal in size with greater than 50% respiratory variability, suggesting right atrial pressure of 3 mmHg.  IAS/Shunts: No atrial level shunt detected by color flow Doppler.   LEFT VENTRICLE PLAX 2D LVIDd:         4.50 cm   Diastology LVIDs:         3.20 cm   LV e' medial:    8.81 cm/s LV PW:         1.10 cm   LV E/e' medial:  8.0 LV IVS:        1.10 cm   LV e' lateral:   9.90 cm/s LVOT diam:     2.00 cm   LV E/e' lateral: 7.1 LV SV:         73 LV SV Index:   34 LVOT Area:     3.14 cm  3D Volume EF: 3D EF:        62 % LV EDV:       118  ml LV ESV:       45 ml LV SV:        73 ml  RIGHT VENTRICLE RV Basal diam:  3.70 cm RV S prime:     15.60 cm/s TAPSE (M-mode): 2.2 cm  LEFT ATRIUM             Index        RIGHT ATRIUM           Index LA diam:        3.40 cm 1.57 cm/m   RA Pressure: 3.00 mmHg LA Vol (A2C):   34.8 ml 16.07 ml/m  RA Area:     20.50 cm LA Vol (A4C):   35.3 ml 16.30 ml/m  RA Volume:   60.60 ml  27.98 ml/m LA Biplane Vol: 35.4 ml 16.35 ml/m AORTIC VALVE LVOT Vmax:   116.00 cm/s LVOT Vmean:  78.100 cm/s LVOT VTI:    0.232 m  AORTA Ao Root diam: 3.50 cm Ao Asc diam:  3.70 cm  MITRAL VALVE               TRICUSPID VALVE MV Area (PHT):             Estimated RAP:  3.00 mmHg MV Decel Time: MV E velocity: 70.70 cm/s  SHUNTS MV A velocity: 78.60 cm/s  Systemic VTI:  0.23 m MV E/A ratio:  0.90        Systemic Diam: 2.00 cm  Carolan Clines Electronically signed by Carolan Clines Signature Date/Time: 02/13/2022/1:51:36 PM    Final     CT SCANS  CT CARDIAC SCORING (SELF PAY ONLY) 01/31/2022  Addendum 02/01/2022  9:47 AM ADDENDUM REPORT: 02/01/2022 09:45  ADDENDUM: The following report is an over-read performed by radiologist Dr. Maudry Mayhew of [facility] on February 01, 2022. This over-read does not include interpretation of cardiac or coronary anatomy or pathology.  The coronary calcium score interpretation by the cardiologist is attached.  COMPARISON:  None.  FINDINGS: Vascular: No acute non-cardiac vascular finding. Aortic atherosclerosis.  Mediastinum/Nodes: Within the visualized portions of the thorax there is no pathologically enlarged mediastinal, or hilar lymph nodes, noting limited sensitivity for the detection of hilar adenopathy on this noncontrast study. Moderate-sized hiatal hernia.  Lungs/Pleura: Right upper lobe perifissural pulmonary nodule measuring 4 mm on image 31/5. Within the visualized portions of the thorax there is no acute consolidative airspace disease, no  pleural effusions and no pneumothorax.  Upper Abdomen: Visualized portions of the upper abdomen are unremarkable.  Musculoskeletal: There are no aggressive appearing lytic or blastic lesions noted in the visualized portions of the skeleton.  IMPRESSION: 1. Right upper lobe 4 mm perifissural pulmonary nodule, consistent with an intrapulmonary lymph node within the upper lobe. No routine follow-up imaging is recommended per Fleischner Society Guidelines. These guidelines do not apply to immunocompromised patients and patients with cancer. Follow up in patients with significant comorbidities as clinically warranted. For lung cancer screening, adhere to Lung-RADS guidelines. Reference: Radiology. 2017; 284(1):228-43; Radiology. 2012; 265(2):611-6; Radiology. 2010; 254(3):949-56. 2.  Aortic Atherosclerosis (ICD10-I70.0).   Electronically Signed By: Maudry Mayhew M.D. On: 02/01/2022 09:45  Narrative CLINICAL DATA:  Cardiovascular Disease Risk stratification  EXAM: Coronary Calcium Score  TECHNIQUE: A gated, non-contrast computed tomography scan of the heart was performed using 3mm slice thickness. Axial images were analyzed on a dedicated workstation. Calcium scoring of the coronary arteries was performed using the Agatston method.  FINDINGS: Coronary arteries: Normal origins.  Coronary Calcium Score:  Left main: 0  Left anterior descending artery: 58.1  Left circumflex artery: 0  Right coronary artery: 158  Total: 216  Percentile: 43rd  Pericardium: Normal.  Aorta: Normal caliber.  Aortic atherosclerosis.  Non-cardiac: See separate report from [facility].  IMPRESSION: 1. Coronary calcium score of 216. This was 43rd percentile for age-, race-, and sex-matched controls. 2. Aortic atherosclerosis.  RECOMMENDATIONS: Coronary artery calcium (CAC) score is a strong predictor of incident coronary heart disease (CHD) and provides  predictive information beyond traditional risk factors. CAC scoring is reasonable to use in the decision to withhold, postpone, or initiate statin therapy in intermediate-risk or selected borderline-risk asymptomatic adults (age 53-75 years and LDL-C >=70 to <190 mg/dL) who do not have diabetes or established atherosclerotic cardiovascular disease (ASCVD).* In intermediate-risk (10-year ASCVD risk >=7.5% to <20%) adults or selected borderline-risk (10-year ASCVD risk >=5% to <7.5%) adults in whom a CAC score is measured for the purpose of making a treatment decision the following recommendations have been made:  If CAC=0, it is reasonable to withhold statin therapy and reassess in 5 to 10 years, as long as higher risk conditions are absent (diabetes mellitus, family history of premature CHD in first degree relatives (males <55 years; females <65 years), cigarette smoking, or LDL >=190 mg/dL).  If CAC is 1 to 99, it is reasonable to initiate statin therapy for patients >=33 years of age.  If CAC is >=100 or >=75th percentile, it is reasonable to initiate statin therapy at any age.  Cardiology referral should be considered for patients with CAC scores >=400 or >=75th percentile.  *2018 AHA/ACC/AACVPR/AAPA/ABC/ACPM/ADA/AGS/APhA/ASPC/NLA/PCNA Guideline on the Management of Blood Cholesterol: A Report of the American College of Cardiology/American Heart Association Task Force on Clinical Practice Guidelines. J Am Coll Cardiol. 2019;73(24):3168-3209.  Zoila Shutter, MD  Electronically Signed: By: Chrystie Nose M.D. On: 01/31/2022 18:56  Recent Labs: No results found for requested labs within last 365 days.  Recent Lipid Panel    Component Value Date/Time   CHOL 146 07/08/2022 0925   TRIG 92 07/08/2022 0925   HDL 59 07/08/2022 0925   CHOLHDL 2.5 07/08/2022 0925   LDLCALC 70 07/08/2022 0925         Physical Exam:    VS:  BP (!) 140/90   Pulse 61   Ht 6'  (1.829 m)   Wt 220 lb (99.8 kg)   SpO2 95%   BMI 29.84 kg/m     Wt Readings from Last 3 Encounters:  07/08/23 220 lb (99.8 kg)  07/10/22 225 lb (102.1 kg)  01/24/22 213 lb (96.6 kg)    Gen: no distress   Neck: No JVD  Ears: R ear Frank Sign Cardiac: No Rubs or Gallops, soft systolic murmur, RRR +2 radial pulses Respiratory: Clear to auscultation bilaterally, normal effort, normal  respiratory rate GI: Soft, nontender, non-distended  MS: No edema; moves all extremities Integument: Skin feels warm Neuro:  At time of evaluation, alert and oriented to person/place/time/situation  Psych: Normal affect, patient feels well   ASSESSMENT:    1. Hyperlipidemia, unspecified hyperlipidemia type   2. Aortic atherosclerosis (HCC)   3. Coronary artery calcification   4. Heart murmur, systolic   5. 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 Cholesterol levels well-controlled (LDL 70). Normal EKG. Emphasized prevention and benefits of current management in reducing coronary artery disease risk. - Continue current medications - Follow up in one year - Repeat testing if new symptoms develop  Aortic Sclerosis - No significant changes or symptoms. No evidence of aortic stenosis. Emphasized monitoring for new symptoms and maintaining current management to prevent progression.  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 Home monitoring recommended. Initiate amlodipine 2.5 mg daily if home readings persistently >=140/90 mmHg. Discussed risks of untreated hypertension and benefits of treatment in reducing coronary artery disease risk and mortality. Emphasized accurate home monitoring to avoid unnecessary treatment and side effects like increased fatigue. - Monitor blood pressure at home until the end of the  year - Start amlodipine 2.5 mg PO daily if home readings persistently >=140/90 mmHg   General Health Maintenance Discussed importance of physical activity for overall health. Recommended 150 minutes of aerobic exercise per week to improve cholesterol, blood pressure, and cardiovascular health. Suggested various exercise options to accommodate preferences and capabilities. - Engage in 150 minutes of aerobic exercise per week - Consider joining a gym or [employer] for structured exercise (he will start with his stationary bike) - Reassess physical activity levels after returning from [place]  Follow-up - Follow up in one year   Medication Adjustments/Labs and Tests Ordered: Current medicines are reviewed at length with the patient today.  Concerns regarding medicines are outlined above.  Orders Placed This Encounter  Procedures   EKG 12-Lead   No orders of the defined types were placed in this encounter.   Patient Instructions  Medication Instructions:  Your physician recommends that you continue on your current medications as directed. Please refer to the Current Medication list given to you today.  *If you need a refill on your cardiac medications before your next appointment, please call your pharmacy*   Lab Work: NONE If you have labs (blood work) drawn today and your  tests are completely normal, you will receive your results only by: MyChart Message (if you have MyChart) OR A paper copy in the mail If you have any lab test that is abnormal or we need to change your treatment, we will call you to review the results.   Testing/Procedures: Please monitor your BP as discussed.    Follow-Up: At [facility], you and your health needs are our priority.  As part of our continuing mission to provide you with exceptional heart care, we have created designated Provider Care Teams.  These Care Teams include your primary Cardiologist (physician) and Advanced Practice Providers  (APPs -  Physician Assistants and Nurse Practitioners) who all work together to provide you with the care you need, when you need it.   Your next appointment:   1 year(s)  Provider:   Riley Lam, MD       Signed, Christell Constant, MD  07/08/2023 1:59 PM    [facility]

## 2023-07-08 NOTE — Patient Instructions (Signed)
Medication Instructions:  Your physician recommends that you continue on your current medications as directed. Please refer to the Current Medication list given to you today.  *If you need a refill on your cardiac medications before your next appointment, please call your pharmacy*   Lab Work: NONE If you have labs (blood work) drawn today and your tests are completely normal, you will receive your results only by: MyChart Message (if you have MyChart) OR A paper copy in the mail If you have any lab test that is abnormal or we need to change your treatment, we will call you to review the results.   Testing/Procedures: Please monitor your BP as discussed.    Follow-Up: At [facility], you and your health needs are our priority.  As part of our continuing mission to provide you with exceptional heart care, we have created designated Provider Care Teams.  These Care Teams include your primary Cardiologist (physician) and Advanced Practice Providers (APPs -  Physician Assistants and Nurse Practitioners) who all work together to provide you with the care you need, when you need it.   Your next appointment:   1 year(s)  Provider:   Riley Lam, MD

## 2023-09-02 ENCOUNTER — Other Ambulatory Visit: Payer: Self-pay | Admitting: Internal Medicine

## 2024-06-09 ENCOUNTER — Other Ambulatory Visit: Payer: Self-pay | Admitting: Internal Medicine

## 2024-09-01 ENCOUNTER — Ambulatory Visit: Admitting: Internal Medicine

## 2024-09-14 ENCOUNTER — Ambulatory Visit: Admitting: Internal Medicine
